# Patient Record
Sex: Male | Born: 1975 | Race: Black or African American | Hispanic: No | Marital: Married | Smoking: Former smoker
Health system: Southern US, Community
[De-identification: ages and names within clinical notes are randomized; demographics above are authoritative.]

## PROBLEM LIST (undated history)

## (undated) DIAGNOSIS — E7800 Pure hypercholesterolemia, unspecified: Secondary | ICD-10-CM

## (undated) DIAGNOSIS — T7840XA Allergy, unspecified, initial encounter: Secondary | ICD-10-CM

## (undated) HISTORY — DX: Allergy, unspecified, initial encounter: T78.40XA

## (undated) HISTORY — PX: OTHER SURGICAL HISTORY: SHX169

---

## 2015-03-22 ENCOUNTER — Encounter (HOSPITAL_COMMUNITY): Payer: Self-pay

## 2015-03-22 ENCOUNTER — Emergency Department (HOSPITAL_COMMUNITY)
Admission: EM | Admit: 2015-03-22 | Discharge: 2015-03-22 | Disposition: A | Discharge status: Home / Self Care | Payer: BLUE CROSS/BLUE SHIELD | Attending: Emergency Medicine | Admitting: Emergency Medicine

## 2015-03-22 DIAGNOSIS — S79922A Unspecified injury of left thigh, initial encounter: Secondary | ICD-10-CM | POA: Diagnosis present

## 2015-03-22 DIAGNOSIS — Y998 Other external cause status: Secondary | ICD-10-CM | POA: Insufficient documentation

## 2015-03-22 DIAGNOSIS — X58XXXA Exposure to other specified factors, initial encounter: Secondary | ICD-10-CM | POA: Diagnosis not present

## 2015-03-22 DIAGNOSIS — T148XXA Other injury of unspecified body region, initial encounter: Secondary | ICD-10-CM

## 2015-03-22 DIAGNOSIS — Y9389 Activity, other specified: Secondary | ICD-10-CM | POA: Insufficient documentation

## 2015-03-22 DIAGNOSIS — S76912A Strain of unspecified muscles, fascia and tendons at thigh level, left thigh, initial encounter: Secondary | ICD-10-CM | POA: Diagnosis not present

## 2015-03-22 DIAGNOSIS — Y9289 Other specified places as the place of occurrence of the external cause: Secondary | ICD-10-CM | POA: Insufficient documentation

## 2015-03-22 NOTE — Discharge Instructions (Signed)
-   Continue to take your muscle relaxer as prescribed by your doctor in [place] - May take over the counter tylenol or ibuprofen for pain control - Use ice or heat to the muscle  - Rest the muscle until pain resolves, do not work out until then.  - Return to ED with worsening pain, numbness or tingling in the left leg, weakness in the left leg or further worsening of symptoms

## 2015-03-22 NOTE — ED Notes (Signed)
Patient c/o left upper inner thigh pain since 03/12/15. Patient states he went to an UC and was told he had pulled a muscle. Patient states the pain is worse.

## 2015-03-22 NOTE — ED Provider Notes (Signed)
CSN: [id number]     Arrival date & time 03/22/15  4098 History   First MD Initiated Contact with Patient 03/22/15 1010     Chief Complaint  Patient presents with  . Leg Pain    HPI  Mario Cain is a 39 year old male presenting with left inner thigh pain for 10 days. He was seen at an urgent care 1 week ago and diagnosed with muscle strain and given muscle relaxer. Pt reports that the muscle pain has gotten worse in the past week. He states that he had intercourse a few days ago which exacerbated the pain. Movement of the legs exacerbates the pain. The pain is not present at rest. He has been using his muscle relaxers but states they do not work because he takes them at night and he "sleeps through it". He has not tried any OTC pain relievers. Denies weakness, tingling or numbness of the affected leg. Denies headaches, chest pain, SOB, abdominal pain, nausea and vomiting.   History reviewed. No pertinent past medical history. History reviewed. No pertinent past surgical history. History reviewed. No pertinent family history. Social History  Substance Use Topics  . Smoking status: Never Smoker   . Smokeless tobacco: Never Used  . Alcohol Use: None     Comment: socially    Review of Systems  Constitutional: Negative for fever.  Respiratory: Negative for shortness of breath.   Cardiovascular: Negative for chest pain.  Gastrointestinal: Negative for nausea, vomiting and abdominal pain.  Musculoskeletal: Positive for myalgias. Negative for back pain and gait problem.  Skin: Negative for rash and wound.  Neurological: Negative for weakness and headaches.      Allergies  Eggs or egg-derived products  Home Medications   Prior to Admission medications   Not on File   BP 119/71 mmHg  Pulse 74  Temp(Src) 98.3 F (36.8 C) (Oral)  Resp 16  SpO2 100% Physical Exam  Constitutional: He is oriented to person, place, and time. He appears well-developed and well-nourished. No distress.   HENT:  Head: Normocephalic and atraumatic.  Neck: Normal range of motion.  Cardiovascular: Normal rate, regular rhythm and normal heart sounds.   Cap refill < 3 Pedal pulses palpable  Pulmonary/Chest: Effort normal and breath sounds normal. No respiratory distress. He has no wheezes. He has no rales.  Musculoskeletal:  Normal ROM of left hip and knee. No TTP of hip. Mild TTP of left anterior and medial thigh. Gait is stable.   Neurological: He is alert and oriented to person, place, and time.  5/5 strength of left hip and knee. Testing strength of left hip elicits pain in inner thigh. Sensation intact to bilateral lower extremities.   Skin: Skin is warm and dry. No rash noted.  Psychiatric: He has a normal mood and affect. His behavior is normal.  Vitals reviewed.   ED Course  Procedures (including critical care time) Labs Review Labs Reviewed - No data to display  Imaging Review No results found. I have personally reviewed and evaluated these images and lab results as part of my medical decision-making.   EKG Interpretation None      MDM   Final diagnoses:  Muscle strain   Muscle Strain Pt presenting with 10 day history of muscle strain. He had been seen by an urgent care and diagnosed with muscle strain and given muscle relaxers. He reports compliance with these with minimal relief. He had intercourse recently which exacerbated his pain. Movement exacerbates pain, rest relieves  it. VSS. Nontoxic appearing. Medial thigh pain reproducible with flexion and abduction of hip. Left leg neurovascularly intact. Instructed pt to continue resting his leg and avoiding activities that exacerbate the leg pain; intercourse and working out specifically. Continue taking the muscle relaxers as prescribed by urgent care. Pt to take OTC motrin or tylenol for pain control. May try ice or heat to the area. Return to ED with increasing pain, numbness, tingling or weakness of left leg. Pt agrees  with this plan    Alveta Heimlich, PA-C 03/22/15 1119  Lorre Nick, MD 03/26/15 [phone]

## 2020-01-20 ENCOUNTER — Other Ambulatory Visit: Payer: Self-pay

## 2020-01-20 ENCOUNTER — Encounter (HOSPITAL_COMMUNITY): Payer: Self-pay

## 2020-01-20 ENCOUNTER — Emergency Department (HOSPITAL_COMMUNITY)
Admission: EM | Admit: 2020-01-20 | Discharge: 2020-01-20 | Disposition: A | Discharge status: Home / Self Care | Payer: BLUE CROSS/BLUE SHIELD | Attending: Emergency Medicine | Admitting: Emergency Medicine

## 2020-01-20 DIAGNOSIS — J069 Acute upper respiratory infection, unspecified: Secondary | ICD-10-CM | POA: Insufficient documentation

## 2020-01-20 DIAGNOSIS — J029 Acute pharyngitis, unspecified: Secondary | ICD-10-CM | POA: Diagnosis present

## 2020-01-20 DIAGNOSIS — Z20822 Contact with and (suspected) exposure to covid-19: Secondary | ICD-10-CM | POA: Insufficient documentation

## 2020-01-20 DIAGNOSIS — J36 Peritonsillar abscess: Secondary | ICD-10-CM | POA: Diagnosis not present

## 2020-01-20 LAB — GROUP A STREP BY PCR: Group A Strep by PCR: NOT DETECTED

## 2020-01-20 LAB — SARS CORONAVIRUS 2 BY RT PCR (HOSPITAL ORDER, PERFORMED IN ~~LOC~~ HOSPITAL LAB): SARS Coronavirus 2: NEGATIVE

## 2020-01-20 MED ORDER — IBUPROFEN 600 MG PO TABS
600.0000 mg | ORAL_TABLET | Freq: Four times a day (QID) | ORAL | 0 refills | Status: DC | PRN
Start: 2020-01-20 — End: 2022-04-29

## 2020-01-20 MED ORDER — CETIRIZINE HCL 10 MG PO TABS
10.0000 mg | ORAL_TABLET | Freq: Every day | ORAL | 0 refills | Status: DC
Start: 2020-01-20 — End: 2023-02-05

## 2020-01-20 NOTE — Discharge Instructions (Addendum)
He was seen in the ER for sore throat and congestion.  The strep test is negative.  We have tested you for COVID-19, the results should be back within the next 2 to 4 hours.  Please quarantine yourself until of Covid test results. Take the medications prescribed for symptom control.

## 2020-01-20 NOTE — ED Provider Notes (Signed)
[facility]-EMERGENCY DEPT Provider Note   CSN: [id number] Arrival date & time: 01/20/20  1433     History Chief Complaint  Patient presents with  . Sore Throat  . Facial Pain    Mario Cain is a 44 y.o. male.  HPI    44 year old male comes in a chief complaint of sore throat, facial pain.  Patient reports that he has been having 3-day history of sore throat, pain with swallowing, cough, URI-like symptoms including left-sided facial pain without earache.  No drainage from the ear.  Patient is also having malaise.  He denies fevers.  Patient denies sick exposures and is vaccinated.  He does indicate that he was in a social setting recently.  Patient has a mild cough.  History reviewed. No pertinent past medical history.  There are no problems to display for this patient.  History reviewed. No pertinent surgical history.     No family history on file.  Social History   Tobacco Use  . Smoking status: Never Smoker  . Smokeless tobacco: Never Used  Substance Use Topics  . Alcohol use: Not on file    Comment: socially  . Drug use: No    Home Medications Prior to Admission medications   Medication Sig Start Date End Date Taking? Authorizing Provider  cetirizine (ZYRTEC ALLERGY) 10 MG tablet Take 1 tablet (10 mg total) by mouth daily. 01/20/20   Derwood Kaplan, MD  ibuprofen (ADVIL) 600 MG tablet Take 1 tablet (600 mg total) by mouth every 6 (six) hours as needed. 01/20/20   Derwood Kaplan, MD    Allergies    Eggs or egg-derived products  Review of Systems   Review of Systems  Constitutional: Positive for activity change.  Respiratory: Positive for shortness of breath.   Cardiovascular: Negative for chest pain.  Gastrointestinal: Negative for nausea and vomiting.  Allergic/Immunologic: Negative for immunocompromised state.  Hematological: Does not bruise/bleed easily.    Physical Exam Updated Vital Signs BP 118/81 (BP Location: Right Arm)    Pulse 79   Temp 98.1 F (36.7 C) (Oral)   Resp 16   SpO2 96%   Physical Exam Vitals and nursing note reviewed.  Constitutional:      Appearance: He is well-developed.  HENT:     Head: Atraumatic.     Nose: No congestion or rhinorrhea.     Mouth/Throat:     Tonsils: Tonsillar exudate and tonsillar abscess present.  Cardiovascular:     Rate and Rhythm: Normal rate.  Pulmonary:     Effort: Pulmonary effort is normal.  Musculoskeletal:     Cervical back: Neck supple.  Skin:    General: Skin is warm.  Neurological:     Mental Status: He is alert and oriented to person, place, and time.     ED Results / Procedures / Treatments   Labs (all labs ordered are listed, but only abnormal results are displayed) Labs Reviewed  GROUP A STREP BY PCR  SARS CORONAVIRUS 2 BY RT PCR (HOSPITAL ORDER, PERFORMED IN [facility])    EKG None  Radiology No results found.  Procedures Procedures (including critical care time)  Medications Ordered in ED Medications - No data to display  ED Course  I have reviewed the triage vital signs and the nursing notes.  Pertinent labs & imaging results that were available during my care of the patient were reviewed by me and considered in my medical decision making (see chart for details).  MDM Rules/Calculators/A&P                          44 year old comes in w/ cc of sore throat, congestion. We will test him for COVID-19.  There are no tonsillar exudates.  No signs of stridor or airway obstruction.  Patient is nontoxic.    Mario Cain was evaluated in Emergency Department on 01/20/2020 for the symptoms described in the history of present illness. He was evaluated in the context of the global COVID-19 pandemic, which necessitated consideration that the patient might be at risk for infection with the SARS-CoV-2 virus that causes COVID-19. Institutional protocols and algorithms that pertain to the evaluation of patients at risk  for COVID-19 are in a state of rapid change based on information released by regulatory bodies including the CDC and federal and state organizations. These policies and algorithms were followed during the patient's care in the ED.  Final Clinical Impression(s) / ED Diagnoses Final diagnoses:  Viral URI  COVID-19 virus test result unknown    Rx / DC Orders ED Discharge Orders         Ordered    ibuprofen (ADVIL) 600 MG tablet  Every 6 hours PRN     Discontinue  Reprint     01/20/20 1918    cetirizine (ZYRTEC ALLERGY) 10 MG tablet  Daily     Discontinue  Reprint     01/20/20 1918           Derwood Kaplan, MD 01/20/20 2036

## 2020-01-20 NOTE — ED Triage Notes (Signed)
Patient c/o sore throat (left side) and sinus congestion since Sunday. Reports taking otc meds with no relief.    A/Ox4 Ambulatory in triage

## 2020-07-10 ENCOUNTER — Other Ambulatory Visit: Payer: Self-pay

## 2020-07-10 ENCOUNTER — Encounter (HOSPITAL_COMMUNITY): Payer: Self-pay | Admitting: Emergency Medicine

## 2020-07-10 ENCOUNTER — Emergency Department (HOSPITAL_COMMUNITY)
Admission: EM | Admit: 2020-07-10 | Discharge: 2020-07-10 | Disposition: A | Discharge status: Home / Self Care | Payer: BC Managed Care – PPO | Attending: Emergency Medicine | Admitting: Emergency Medicine

## 2020-07-10 DIAGNOSIS — M79602 Pain in left arm: Secondary | ICD-10-CM | POA: Insufficient documentation

## 2020-07-10 DIAGNOSIS — R202 Paresthesia of skin: Secondary | ICD-10-CM

## 2020-07-10 HISTORY — DX: Pure hypercholesterolemia, unspecified: E78.00

## 2020-07-10 NOTE — Discharge Instructions (Addendum)
Take 400 mg of ibuprofen every 6 hours for the next several days.  Return here for any problems

## 2020-07-10 NOTE — ED Provider Notes (Signed)
[facility]-EMERGENCY DEPT Provider Note   CSN: [id number] Arrival date & time: 07/10/20  1731     History Chief Complaint  Patient presents with   Arm Pain    Mario Cain is a 44 y.o. male.  44 year old male presents with left-sided arm pain times several days.  Patient states that is worse with certain movement and seems to start in his neck.  Denies any wrist drop.  No recent history of trauma.  Symptoms better with remaining still no treatment use prior to arrival        Past Medical History:  Diagnosis Date   High cholesterol     There are no problems to display for this patient.   History reviewed. No pertinent surgical history.     History reviewed. No pertinent family history.  Social History   Tobacco Use   Smoking status: Never Smoker   Smokeless tobacco: Never Used  Substance Use Topics   Drug use: No    Home Medications Prior to Admission medications   Medication Sig Start Date End Date Taking? Authorizing Provider  cetirizine (ZYRTEC ALLERGY) 10 MG tablet Take 1 tablet (10 mg total) by mouth daily. 01/20/20   Derwood Kaplan, MD  ibuprofen (ADVIL) 600 MG tablet Take 1 tablet (600 mg total) by mouth every 6 (six) hours as needed. 01/20/20   Derwood Kaplan, MD    Allergies    Eggs or egg-derived products  Review of Systems   Review of Systems  All other systems reviewed and are negative.   Physical Exam Updated Vital Signs BP 128/85 (BP Location: Right Arm)    Pulse 83    Temp 98.2 F (36.8 C) (Oral)    Resp 16    Ht 1.702 m (5\' 7" )    Wt 71.7 kg    SpO2 94%    BMI 24.75 kg/m   Physical Exam Vitals and nursing note reviewed.  Constitutional:      General: He is not in acute distress.    Appearance: Normal appearance. He is well-developed and well-nourished. He is not toxic-appearing.  HENT:     Head: Normocephalic and atraumatic.  Eyes:     General: Lids are normal.     Extraocular Movements: EOM normal.      Conjunctiva/sclera: Conjunctivae normal.     Pupils: Pupils are equal, round, and reactive to light.  Neck:     Thyroid: No thyroid mass.     Trachea: No tracheal deviation.  Cardiovascular:     Rate and Rhythm: Normal rate and regular rhythm.     Heart sounds: Normal heart sounds. No murmur heard. No gallop.   Pulmonary:     Effort: Pulmonary effort is normal. No respiratory distress.     Breath sounds: Normal breath sounds. No stridor. No decreased breath sounds, wheezing, rhonchi or rales.  Abdominal:     General: Bowel sounds are normal. There is no distension.     Palpations: Abdomen is soft.     Tenderness: There is no abdominal tenderness. There is no CVA tenderness or rebound.  Musculoskeletal:        General: No tenderness or edema. Normal range of motion.     Cervical back: Normal range of motion and neck supple.     Comments: Tenderness to palpation in left axilla.  Median, ulnar, radial nerves all intact.  Sensation also normal.  Skin:    General: Skin is warm and dry.     Findings: No abrasion  or rash.  Neurological:     Mental Status: He is alert and oriented to person, place, and time.     GCS: GCS eye subscore is 4. GCS verbal subscore is 5. GCS motor subscore is 6.     Cranial Nerves: No cranial nerve deficit.     Sensory: No sensory deficit.     Deep Tendon Reflexes: Strength normal.  Psychiatric:        Mood and Affect: Mood and affect normal.        Speech: Speech normal.        Behavior: Behavior normal.     ED Results / Procedures / Treatments   Labs (all labs ordered are listed, but only abnormal results are displayed) Labs Reviewed - No data to display  EKG None  Radiology No results found.  Procedures Procedures (including critical care time)  Medications Ordered in ED Medications - No data to display  ED Course  I have reviewed the triage vital signs and the nursing notes.  Pertinent labs & imaging results that were available  during my care of the patient were reviewed by me and considered in my medical decision making (see chart for details).    MDM Rules/Calculators/A&P                          Patient symptoms likely due to nerve root irritation.  Will prescribe ibuprofen return precautions given Final Clinical Impression(s) / ED Diagnoses Final diagnoses:  None    Rx / DC Orders ED Discharge Orders    None       Lorre Nick, MD 07/10/20 1755

## 2020-07-10 NOTE — ED Triage Notes (Signed)
Pt states that he has had a pain down the back of his L arm that worsens with movement of his arms and neck since yesterday. Denies chest pain. Alert and oriented.

## 2020-07-20 ENCOUNTER — Other Ambulatory Visit: Payer: Self-pay | Admitting: Family Medicine

## 2020-07-20 ENCOUNTER — Ambulatory Visit
Admission: RE | Admit: 2020-07-20 | Discharge: 2020-07-20 | Disposition: A | Discharge status: Home / Self Care | Payer: BC Managed Care – PPO | Source: Ambulatory Visit | Attending: Family Medicine | Admitting: Family Medicine

## 2020-07-20 DIAGNOSIS — M542 Cervicalgia: Secondary | ICD-10-CM

## 2021-02-09 ENCOUNTER — Emergency Department (HOSPITAL_BASED_OUTPATIENT_CLINIC_OR_DEPARTMENT_OTHER): Payer: BC Managed Care – PPO | Admitting: Radiology

## 2021-02-09 ENCOUNTER — Other Ambulatory Visit (HOSPITAL_BASED_OUTPATIENT_CLINIC_OR_DEPARTMENT_OTHER): Payer: Self-pay

## 2021-02-09 ENCOUNTER — Emergency Department (HOSPITAL_BASED_OUTPATIENT_CLINIC_OR_DEPARTMENT_OTHER)
Admission: EM | Admit: 2021-02-09 | Discharge: 2021-02-09 | Disposition: A | Discharge status: Home / Self Care | Payer: BC Managed Care – PPO | Attending: Emergency Medicine | Admitting: Emergency Medicine

## 2021-02-09 ENCOUNTER — Encounter (HOSPITAL_BASED_OUTPATIENT_CLINIC_OR_DEPARTMENT_OTHER): Payer: Self-pay | Admitting: Emergency Medicine

## 2021-02-09 ENCOUNTER — Other Ambulatory Visit: Payer: Self-pay

## 2021-02-09 DIAGNOSIS — R0789 Other chest pain: Secondary | ICD-10-CM | POA: Diagnosis not present

## 2021-02-09 DIAGNOSIS — K30 Functional dyspepsia: Secondary | ICD-10-CM | POA: Insufficient documentation

## 2021-02-09 DIAGNOSIS — R0781 Pleurodynia: Secondary | ICD-10-CM | POA: Diagnosis present

## 2021-02-09 DIAGNOSIS — R203 Hyperesthesia: Secondary | ICD-10-CM | POA: Insufficient documentation

## 2021-02-09 DIAGNOSIS — R110 Nausea: Secondary | ICD-10-CM | POA: Insufficient documentation

## 2021-02-09 DIAGNOSIS — R079 Chest pain, unspecified: Secondary | ICD-10-CM

## 2021-02-09 LAB — COMPREHENSIVE METABOLIC PANEL
ALT: 17 U/L (ref 0–44)
AST: 18 U/L (ref 15–41)
Albumin: 4.5 g/dL (ref 3.5–5.0)
Alkaline Phosphatase: 48 U/L (ref 38–126)
Anion gap: 10 (ref 5–15)
BUN: 11 mg/dL (ref 6–20)
CO2: 26 mmol/L (ref 22–32)
Calcium: 9.4 mg/dL (ref 8.9–10.3)
Chloride: 101 mmol/L (ref 98–111)
Creatinine, Ser: 1.05 mg/dL (ref 0.61–1.24)
GFR, Estimated: >60 mL/min (ref >60)
Glucose, Bld: 93 mg/dL (ref 70–99)
Potassium: 4.3 mmol/L (ref 3.5–5.1)
Sodium: 137 mmol/L (ref 135–145)
Total Bilirubin: 0.7 mg/dL (ref 0.3–1.2)
Total Protein: 7.6 g/dL (ref 6.5–8.1)

## 2021-02-09 LAB — CBC WITH DIFFERENTIAL/PLATELET
Abs Immature Granulocytes: 0.01 10*3/uL (ref 0.00–0.07)
Basophils Absolute: 0.1 10*3/uL (ref 0.0–0.1)
Basophils Relative: 2 %
Eosinophils Absolute: 0.3 10*3/uL (ref 0.0–0.5)
Eosinophils Relative: 8 %
HCT: 46.5 % (ref 39.0–52.0)
Hemoglobin: 16 g/dL (ref 13.0–17.0)
Immature Granulocytes: 0 %
Lymphocytes Relative: 30 %
Lymphs Abs: 1.3 10*3/uL (ref 0.7–4.0)
MCH: 29.7 pg (ref 26.0–34.0)
MCHC: 34.4 g/dL (ref 30.0–36.0)
MCV: 86.3 fL (ref 80.0–100.0)
Monocytes Absolute: 0.8 10*3/uL (ref 0.1–1.0)
Monocytes Relative: 17 %
Neutro Abs: 1.8 10*3/uL (ref 1.7–7.7)
Neutrophils Relative %: 43 %
Platelets: 289 10*3/uL (ref 150–400)
RBC: 5.39 MIL/uL (ref 4.22–5.81)
RDW: 12.3 % (ref 11.5–15.5)
WBC: 4.3 10*3/uL (ref 4.0–10.5)
nRBC: 0 % (ref 0.0–0.2)

## 2021-02-09 LAB — TROPONIN I (HIGH SENSITIVITY): Troponin I (High Sensitivity): <2 ng/L (ref <18)

## 2021-02-09 MED ORDER — CYCLOBENZAPRINE HCL 10 MG PO TABS
10.0000 mg | ORAL_TABLET | Freq: Two times a day (BID) | ORAL | 0 refills | Status: DC | PRN
Start: 2021-02-09 — End: 2022-04-29
  Filled 2021-02-09: qty 20, 10d supply, fill #0

## 2021-02-09 NOTE — ED Notes (Signed)
Patient verbalizes understanding of discharge instructions. Opportunity for questioning and answers were provided. Patient discharged from ED.  °

## 2021-02-09 NOTE — ED Provider Notes (Signed)
[facility] EMERGENCY DEPT Provider Note   CSN: [id number] Arrival date & time: 02/09/21  8527     History Chief Complaint  Patient presents with   Rib Injury    Mario Cain is a 45 y.o. male.  HPI     Left rib pain began 7/18 No injury, no recalled overuse injuries  Pain is sharp, severe, worse even with light touch No rash No shortness of breath Some nausea and indigestion  in association with the pain No leg pain or swelling, no recent surgeries or immobilization   Past Medical History:  Diagnosis Date   High cholesterol     There are no problems to display for this patient.   History reviewed. No pertinent surgical history.     History reviewed. No pertinent family history.  Social History   Tobacco Use   Smoking status: Never   Smokeless tobacco: Never  Substance Use Topics   Drug use: No    Home Medications Prior to Admission medications   Medication Sig Start Date End Date Taking? Authorizing Provider  cetirizine (ZYRTEC ALLERGY) 10 MG tablet Take 1 tablet (10 mg total) by mouth daily. 01/20/20  Yes Derwood Kaplan, MD  cyclobenzaprine (FLEXERIL) 10 MG tablet Take 1 tablet (10 mg total) by mouth 2 (two) times daily as needed for muscle spasms. 02/09/21  Yes Alvira Monday, MD  ibuprofen (ADVIL) 600 MG tablet Take 1 tablet (600 mg total) by mouth every 6 (six) hours as needed. 01/20/20  Yes Derwood Kaplan, MD    Allergies    Eggs or egg-derived products  Review of Systems   Review of Systems  Constitutional:  Negative for fever.  Respiratory:  Negative for cough and shortness of breath.   Cardiovascular:  Positive for chest pain.  Gastrointestinal:  Positive for nausea. Negative for abdominal pain, diarrhea and vomiting.  Genitourinary:  Negative for dysuria.  Musculoskeletal:  Negative for back pain.  Skin:  Negative for rash.  Neurological:  Negative for headaches.   Physical Exam Updated Vital Signs BP 114/85   Pulse  70   Temp 98.4 F (36.9 C) (Oral)   Resp 20   Ht 5\' 7"  (1.702 m)   Wt 74.8 kg   SpO2 98%   BMI 25.84 kg/m   Physical Exam Vitals and nursing note reviewed.  Constitutional:      General: He is not in acute distress.    Appearance: He is well-developed. He is not diaphoretic.  HENT:     Head: Normocephalic and atraumatic.  Eyes:     Conjunctiva/sclera: Conjunctivae normal.  Cardiovascular:     Rate and Rhythm: Normal rate and regular rhythm.     Heart sounds: No murmur heard.   No friction rub.  Pulmonary:     Effort: Pulmonary effort is normal. No respiratory distress.     Breath sounds: Normal breath sounds. No wheezing or rales.  Chest:     Chest wall: Tenderness present.  Abdominal:     General: There is no distension.     Palpations: Abdomen is soft.     Tenderness: There is no abdominal tenderness. There is no guarding.  Musculoskeletal:     Cervical back: Normal range of motion.  Skin:    General: Skin is warm and dry.  Neurological:     Mental Status: He is alert and oriented to person, place, and time.    ED Results / Procedures / Treatments   Labs (all labs ordered are listed, but  only abnormal results are displayed) Labs Reviewed  CBC WITH DIFFERENTIAL/PLATELET  COMPREHENSIVE METABOLIC PANEL  TROPONIN I (HIGH SENSITIVITY)    EKG EKG Interpretation  Date/Time:  Wednesday February 09 2021 10:40:52 EDT Ventricular Rate:  68 PR Interval:  217 QRS Duration: 83 QT Interval:  378 QTC Calculation: 402 R Axis:   35 Text Interpretation: Sinus rhythm Prolonged PR interval Low voltage, precordial leads Confirmed by Benjiman Core [phone]) on 02/10/2021 10:09:18 AM  Radiology DG Chest 2 View  Result Date: 02/09/2021 CLINICAL DATA:  Left rib pain and tenderness to touch about the lower chest. Symptoms began 02/07/2021. No known injury. EXAM: CHEST - 2 VIEW COMPARISON:  None. FINDINGS: Lungs clear. Heart size normal. No pneumothorax or pleural fluid. No acute  or focal bony abnormality. IMPRESSION: Normal exam. Electronically Signed   By: Drusilla Kanner M.D.   On: 02/09/2021 10:52    Procedures Procedures   Medications Ordered in ED Medications - No data to display  ED Course  I have reviewed the triage vital signs and the nursing notes.  Pertinent labs & imaging results that were available during my care of the patient were reviewed by me and considered in my medical decision making (see chart for details).    MDM Rules/Calculators/A&P                           45yo male with history of hyperlipidemia presents with left sided chest pain.  Chest pain with hyperesthesia, suspect more likely msk, nerve related or shingles without rash  NO dyspnea, asymmetric leg swelling, PERC negative and doubt PE.  Given sensation of nausea and indigestion, ordered testing to evaluate for ACS.  Troponin negative. EKG without acute changes. Patient discharged in stable condition with understanding of reasons to return.     Final Clinical Impression(s) / ED Diagnoses Final diagnoses:  Left-sided chest pain    Rx / DC Orders ED Discharge Orders          Ordered    cyclobenzaprine (FLEXERIL) 10 MG tablet  2 times daily PRN        02/09/21 1129             Alvira Monday, MD 02/10/21 2233

## 2021-02-09 NOTE — ED Triage Notes (Signed)
Pt arrives to ED with c/o of left sided rib cage pain. Pt reports tenderness to touch to the left lower ribs. Pain started on Monday morning 7/18 when he woke up. No known injury to area. Pt denies N/V, fevers, CP, or SOB.

## 2021-02-09 NOTE — ED Notes (Signed)
Patient is resting comfortably. 

## 2021-02-09 NOTE — Discharge Instructions (Addendum)
You may have zoster sine herpete versus other nerve related pain. Please follow up with your doctor for further evaluation and treatment, if you develop a shingles rash, you may seek treatment with PCP or urgent care.

## 2021-02-22 DIAGNOSIS — Z13228 Encounter for screening for other metabolic disorders: Secondary | ICD-10-CM | POA: Diagnosis not present

## 2021-02-22 DIAGNOSIS — Z13220 Encounter for screening for lipoid disorders: Secondary | ICD-10-CM | POA: Diagnosis not present

## 2021-02-22 DIAGNOSIS — E782 Mixed hyperlipidemia: Secondary | ICD-10-CM | POA: Diagnosis not present

## 2021-02-22 DIAGNOSIS — E079 Disorder of thyroid, unspecified: Secondary | ICD-10-CM | POA: Diagnosis not present

## 2021-02-22 DIAGNOSIS — Z0000 Encounter for general adult medical examination without abnormal findings: Secondary | ICD-10-CM | POA: Diagnosis not present

## 2021-02-24 ENCOUNTER — Other Ambulatory Visit (HOSPITAL_BASED_OUTPATIENT_CLINIC_OR_DEPARTMENT_OTHER): Payer: Self-pay

## 2021-02-25 ENCOUNTER — Other Ambulatory Visit: Payer: Self-pay | Admitting: Family Medicine

## 2021-02-25 DIAGNOSIS — K4090 Unilateral inguinal hernia, without obstruction or gangrene, not specified as recurrent: Secondary | ICD-10-CM

## 2021-03-01 ENCOUNTER — Other Ambulatory Visit: Payer: BC Managed Care – PPO

## 2021-03-09 ENCOUNTER — Other Ambulatory Visit: Payer: Self-pay | Admitting: Orthopedic Surgery

## 2021-03-09 DIAGNOSIS — M25552 Pain in left hip: Secondary | ICD-10-CM

## 2021-03-15 ENCOUNTER — Ambulatory Visit
Admission: RE | Admit: 2021-03-15 | Discharge: 2021-03-15 | Disposition: A | Discharge status: Home / Self Care | Payer: BC Managed Care – PPO | Source: Ambulatory Visit | Attending: Orthopedic Surgery | Admitting: Orthopedic Surgery

## 2021-03-15 ENCOUNTER — Other Ambulatory Visit: Payer: Self-pay

## 2021-03-15 DIAGNOSIS — M25552 Pain in left hip: Secondary | ICD-10-CM

## 2021-03-15 MED ORDER — IOPAMIDOL (ISOVUE-M 200) INJECTION 41%
1.0000 mL | Freq: Once | INTRAMUSCULAR | Status: AC
  Administered 2021-03-15: 1 mL via INTRA_ARTICULAR

## 2021-03-15 MED ORDER — METHYLPREDNISOLONE ACETATE 40 MG/ML INJ SUSP (RADIOLOG
80.0000 mg | Freq: Once | INTRAMUSCULAR | Status: AC
  Administered 2021-03-15: 80 mg via INTRA_ARTICULAR

## 2021-04-23 ENCOUNTER — Emergency Department (HOSPITAL_BASED_OUTPATIENT_CLINIC_OR_DEPARTMENT_OTHER)
Admission: EM | Admit: 2021-04-23 | Discharge: 2021-04-23 | Disposition: A | Discharge status: Home / Self Care | Payer: BC Managed Care – PPO | Attending: Emergency Medicine | Admitting: Emergency Medicine

## 2021-04-23 ENCOUNTER — Encounter (HOSPITAL_BASED_OUTPATIENT_CLINIC_OR_DEPARTMENT_OTHER): Payer: Self-pay | Admitting: Emergency Medicine

## 2021-04-23 ENCOUNTER — Other Ambulatory Visit: Payer: Self-pay

## 2021-04-23 DIAGNOSIS — R519 Headache, unspecified: Secondary | ICD-10-CM | POA: Insufficient documentation

## 2021-04-23 DIAGNOSIS — H53149 Visual discomfort, unspecified: Secondary | ICD-10-CM | POA: Diagnosis not present

## 2021-04-23 MED ORDER — DIPHENHYDRAMINE HCL 50 MG/ML IJ SOLN
50.0000 mg | Freq: Once | INTRAMUSCULAR | Status: AC
  Administered 2021-04-23: 50 mg via INTRAVENOUS
  Filled 2021-04-23: qty 1

## 2021-04-23 MED ORDER — SODIUM CHLORIDE 0.9 % IV BOLUS
1000.0000 mL | Freq: Once | INTRAVENOUS | Status: AC
  Administered 2021-04-23: 1000 mL via INTRAVENOUS

## 2021-04-23 MED ORDER — KETOROLAC TROMETHAMINE 30 MG/ML IJ SOLN
30.0000 mg | Freq: Once | INTRAMUSCULAR | Status: AC
  Administered 2021-04-23: 30 mg via INTRAVENOUS
  Filled 2021-04-23: qty 1

## 2021-04-23 MED ORDER — PROCHLORPERAZINE EDISYLATE 10 MG/2ML IJ SOLN
10.0000 mg | Freq: Once | INTRAMUSCULAR | Status: AC
  Administered 2021-04-23: 1 mg via INTRAVENOUS
  Filled 2021-04-23: qty 2

## 2021-04-23 NOTE — ED Notes (Signed)
Pt alert, oriented and resting at present. States his pain is gone and that he "just feels sleepy" at present.  Wife at bedside.  IV infusing without complication.

## 2021-04-23 NOTE — ED Notes (Signed)
Dr Tegeler in room w/pt now. 

## 2021-04-23 NOTE — ED Triage Notes (Signed)
Pt reports posterior right side headache pain and tension since Monday.  Tried OTC medications (tylenol, excedrin, ibuprofen) without relief.  Pt reports pain is constant. Pain worse with palpation.

## 2021-04-23 NOTE — ED Provider Notes (Signed)
[facility] EMERGENCY DEPT Provider Note   CSN: [id number] Arrival date & time: 04/23/21  1003     History Chief Complaint  Patient presents with   Headache    Mario Cain is a 45 y.o. male.  The history is provided by the patient and medical records. No language interpreter was used.  Headache Pain location:  R temporal and R parietal Quality:  Dull Radiates to:  Does not radiate Severity currently:  7/10 Severity at highest:  7/10 Onset quality:  Gradual Duration:  6 days Timing:  Constant Progression:  Waxing and waning Chronicity:  Recurrent Similar to prior headaches: yes   Context: bright light   Relieved by:  Nothing Worsened by:  Nothing Ineffective treatments:  NSAIDs Associated symptoms: photophobia   Associated symptoms: no abdominal pain, no back pain, no blurred vision, no congestion, no cough, no diarrhea, no dizziness, no eye pain, no facial pain, no fatigue, no fever, no loss of balance, no nausea, no neck pain, no neck stiffness, no numbness, no paresthesias, no seizures, no URI, no visual change, no vomiting and no weakness       Past Medical History:  Diagnosis Date   High cholesterol     There are no problems to display for this patient.   History reviewed. No pertinent surgical history.     No family history on file.  Social History   Tobacco Use   Smoking status: Never   Smokeless tobacco: Never  Substance Use Topics   Drug use: No    Home Medications Prior to Admission medications   Medication Sig Start Date End Date Taking? Authorizing Provider  Garlic 10 MG CAPS Take by mouth.   Yes [provider]  Ginkgo Biloba 40 MG TABS Take by mouth.   Yes [provider]  Turmeric (QC TUMERIC COMPLEX PO) Take by mouth.   Yes [provider]  cetirizine (ZYRTEC ALLERGY) 10 MG tablet Take 1 tablet (10 mg total) by mouth daily. 01/20/20   Derwood Kaplan, MD  cyclobenzaprine (FLEXERIL) 10 MG tablet  Take 1 tablet (10 mg total) by mouth 2 (two) times daily as needed for muscle spasms. 02/09/21   Alvira Monday, MD  ibuprofen (ADVIL) 600 MG tablet Take 1 tablet (600 mg total) by mouth every 6 (six) hours as needed. 01/20/20   Derwood Kaplan, MD    Allergies    Eggs or egg-derived products  Review of Systems   Review of Systems  Constitutional:  Negative for chills, fatigue and fever.  HENT:  Negative for congestion.   Eyes:  Positive for photophobia. Negative for blurred vision, pain, redness and visual disturbance.  Respiratory:  Negative for cough, chest tightness, shortness of breath and wheezing.   Cardiovascular:  Negative for chest pain and palpitations.  Gastrointestinal:  Negative for abdominal pain, constipation, diarrhea, nausea and vomiting.  Genitourinary:  Negative for flank pain.  Musculoskeletal:  Negative for back pain, neck pain and neck stiffness.  Neurological:  Positive for headaches. Negative for dizziness, seizures, weakness, light-headedness, numbness, paresthesias and loss of balance.  Psychiatric/Behavioral:  Negative for agitation and confusion.   All other systems reviewed and are negative.  Physical Exam Updated Vital Signs BP 126/89 (BP Location: Right Arm)   Pulse 70   Temp 98.3 F (36.8 C) (Oral)   Resp 16   Ht 5\' 7"  (1.702 m)   Wt 74.8 kg   SpO2 95%   BMI 25.84 kg/m   Physical Exam Vitals and nursing  note reviewed.  Constitutional:      General: He is not in acute distress.    Appearance: He is well-developed. He is not ill-appearing, toxic-appearing or diaphoretic.  HENT:     Head: Normocephalic and atraumatic.     Mouth/Throat:     Mouth: Mucous membranes are moist.  Eyes:     Extraocular Movements:     Right eye: Normal extraocular motion and no nystagmus.     Left eye: Normal extraocular motion and no nystagmus.     Conjunctiva/sclera: Conjunctivae normal.  Cardiovascular:     Rate and Rhythm: Normal rate and regular rhythm.      Heart sounds: No murmur heard. Pulmonary:     Effort: Pulmonary effort is normal. No respiratory distress.     Breath sounds: Normal breath sounds. No wheezing, rhonchi or rales.  Chest:     Chest wall: No tenderness.  Abdominal:     Palpations: Abdomen is soft.     Tenderness: There is no abdominal tenderness.  Musculoskeletal:        General: No swelling or tenderness.     Cervical back: Normal range of motion and neck supple. No rigidity.  Skin:    General: Skin is warm and dry.     Capillary Refill: Capillary refill takes less than 2 seconds.     Coloration: Skin is not pale.  Neurological:     Mental Status: He is alert.     Cranial Nerves: No cranial nerve deficit, dysarthria or facial asymmetry.     Sensory: No sensory deficit.     Motor: No weakness.     Coordination: Coordination normal.  Psychiatric:        Mood and Affect: Mood normal.        Behavior: Behavior is not agitated.    ED Results / Procedures / Treatments   Labs (all labs ordered are listed, but only abnormal results are displayed) Labs Reviewed - No data to display  EKG None  Radiology No results found.  Procedures Procedures   Medications Ordered in ED Medications  sodium chloride 0.9 % bolus 1,000 mL (0 mLs Intravenous Stopped 04/23/21 1207)  prochlorperazine (COMPAZINE) injection 10 mg (1 mg Intravenous Given 04/23/21 1107)  diphenhydrAMINE (BENADRYL) injection 50 mg (50 mg Intravenous Given 04/23/21 1108)  ketorolac (TORADOL) 30 MG/ML injection 30 mg (30 mg Intravenous Given 04/23/21 1108)    ED Course  I have reviewed the triage vital signs and the nursing notes.  Pertinent labs & imaging results that were available during my care of the patient were reviewed by me and considered in my medical decision making (see chart for details).    MDM Rules/Calculators/A&P                           Mario Cain is a 45 y.o. male with a past medical history significant for hyper cholesterolemia  and remote history of headaches who presents with headache.  According to patient, for the last 6 days or so he has had photophobia and moderate headache that has not been improving with his normal regimen of Motrin.  He reports no significant vision changes, nausea, vomiting and also denies any focal neurologic deficits.  Denies any trauma.  He reports it comes and goes but is been present longer than normal.  He reports is primarily on the right side of his head but denies any radiation into his neck or back and denies any  other focal neurologic complaints.  Patient denies fevers, chills, neck stiffness, rashes, or other medication use.  On exam, lungs clear and chest nontender.  Abdomen nontender.  No focal neurologic deficits on exam.  Pupil symmetric reactive normal extract movements.  Symmetric smile.  Normal finger-nose-finger testing.  Normal sensation and strength throughout extremities.  Normal range of motion of neck.  Patient well-appearing otherwise.  Clinically suspect a tension versus migrainous type headache.  Patient will be given a headache cocktail and reassess.  Low suspicion for acute intracranial bleed, stroke, infection, or other acute cause of symptoms at this time.  If symptoms improve, dissipate discharge home with patient follow-up with PCP  Patient's headache has nearly resolved and he is feeling better.  Patient be discharged home to follow-up with PCP and will be also given information for outpatient neurology.  Patient agrees and was discharged in good condition with improved symptoms.   Final Clinical Impression(s) / ED Diagnoses Final diagnoses:  Acute nonintractable headache, unspecified headache type    Rx / DC Orders ED Discharge Orders     None      Clinical Impression: 1. Acute nonintractable headache, unspecified headache type     Disposition: Discharge  Condition: Good  I have discussed the results, Dx and Tx plan with the pt(& family if  present). He/she/they expressed understanding and agree(s) with the plan. Discharge instructions discussed at great length. Strict return precautions discussed and pt &/or family have verbalized understanding of the instructions. No further questions at time of discharge.    New Prescriptions   No medications on file    Follow Up: Leilani Able, MD [address] [place] [address] [phone]     [facility] [address]     [address] [place] [address] [phone]    [facility] [address] [place] [address] [phone]       Danijah Noh, Canary Brim, MD 04/23/21 1515

## 2021-04-23 NOTE — ED Notes (Signed)
Dr Tegeler advised of pt status and condition.  Plan to hold remaining compazine administration at this time.

## 2021-04-23 NOTE — ED Notes (Signed)
Pt reports having taken Ashwagandha root and Lion's mane supplements for months (unavailable to load into med list formulary), but that he recently stopped taking them and all other supplements when his headache started.

## 2021-04-23 NOTE — ED Notes (Signed)
Upon administration of 0.79ml IV compazine, pt heartrate began climbing to 122bpm and pt stated he began to "feel weird" again.  Stopped administration of IV compazine and placed pt on heart monitor.

## 2021-04-23 NOTE — Discharge Instructions (Signed)
Your history and exam today are consistent with headache likely caused from a tension or migrainous source.  Your exam was otherwise completely reassuring from a neurologic standpoint and your symptoms improved with the headache medicines.  After fluids and medicines we feel you are safe for discharge home.  Please follow-up with PCP but also consider following up with neurology if your headaches persist.  If any symptoms change or worsen acutely, please return to the nearest emergency department.  Please rest and stay hydrated.

## 2021-05-09 DIAGNOSIS — M9901 Segmental and somatic dysfunction of cervical region: Secondary | ICD-10-CM | POA: Diagnosis not present

## 2021-05-09 DIAGNOSIS — M546 Pain in thoracic spine: Secondary | ICD-10-CM | POA: Diagnosis not present

## 2021-05-09 DIAGNOSIS — M62830 Muscle spasm of back: Secondary | ICD-10-CM | POA: Diagnosis not present

## 2021-05-09 DIAGNOSIS — M9902 Segmental and somatic dysfunction of thoracic region: Secondary | ICD-10-CM | POA: Diagnosis not present

## 2021-05-09 DIAGNOSIS — Z0289 Encounter for other administrative examinations: Secondary | ICD-10-CM | POA: Diagnosis not present

## 2021-05-13 DIAGNOSIS — M9901 Segmental and somatic dysfunction of cervical region: Secondary | ICD-10-CM | POA: Diagnosis not present

## 2021-05-13 DIAGNOSIS — M546 Pain in thoracic spine: Secondary | ICD-10-CM | POA: Diagnosis not present

## 2021-05-13 DIAGNOSIS — M62830 Muscle spasm of back: Secondary | ICD-10-CM | POA: Diagnosis not present

## 2021-05-13 DIAGNOSIS — M9902 Segmental and somatic dysfunction of thoracic region: Secondary | ICD-10-CM | POA: Diagnosis not present

## 2021-05-18 DIAGNOSIS — M9903 Segmental and somatic dysfunction of lumbar region: Secondary | ICD-10-CM | POA: Diagnosis not present

## 2021-05-18 DIAGNOSIS — M9902 Segmental and somatic dysfunction of thoracic region: Secondary | ICD-10-CM | POA: Diagnosis not present

## 2021-05-18 DIAGNOSIS — M62830 Muscle spasm of back: Secondary | ICD-10-CM | POA: Diagnosis not present

## 2021-05-18 DIAGNOSIS — M9905 Segmental and somatic dysfunction of pelvic region: Secondary | ICD-10-CM | POA: Diagnosis not present

## 2021-05-23 DIAGNOSIS — M9902 Segmental and somatic dysfunction of thoracic region: Secondary | ICD-10-CM | POA: Diagnosis not present

## 2021-05-23 DIAGNOSIS — M62830 Muscle spasm of back: Secondary | ICD-10-CM | POA: Diagnosis not present

## 2021-05-23 DIAGNOSIS — M9905 Segmental and somatic dysfunction of pelvic region: Secondary | ICD-10-CM | POA: Diagnosis not present

## 2021-05-23 DIAGNOSIS — M9903 Segmental and somatic dysfunction of lumbar region: Secondary | ICD-10-CM | POA: Diagnosis not present

## 2021-06-01 DIAGNOSIS — M9903 Segmental and somatic dysfunction of lumbar region: Secondary | ICD-10-CM | POA: Diagnosis not present

## 2021-06-01 DIAGNOSIS — M62830 Muscle spasm of back: Secondary | ICD-10-CM | POA: Diagnosis not present

## 2021-06-01 DIAGNOSIS — M9902 Segmental and somatic dysfunction of thoracic region: Secondary | ICD-10-CM | POA: Diagnosis not present

## 2021-06-01 DIAGNOSIS — M9905 Segmental and somatic dysfunction of pelvic region: Secondary | ICD-10-CM | POA: Diagnosis not present

## 2021-06-20 DIAGNOSIS — M62830 Muscle spasm of back: Secondary | ICD-10-CM | POA: Diagnosis not present

## 2021-06-20 DIAGNOSIS — M9905 Segmental and somatic dysfunction of pelvic region: Secondary | ICD-10-CM | POA: Diagnosis not present

## 2021-06-20 DIAGNOSIS — M9902 Segmental and somatic dysfunction of thoracic region: Secondary | ICD-10-CM | POA: Diagnosis not present

## 2021-06-20 DIAGNOSIS — M9903 Segmental and somatic dysfunction of lumbar region: Secondary | ICD-10-CM | POA: Diagnosis not present

## 2021-07-02 ENCOUNTER — Encounter (HOSPITAL_BASED_OUTPATIENT_CLINIC_OR_DEPARTMENT_OTHER): Payer: Self-pay

## 2021-07-02 ENCOUNTER — Emergency Department (HOSPITAL_BASED_OUTPATIENT_CLINIC_OR_DEPARTMENT_OTHER)
Admission: EM | Admit: 2021-07-02 | Discharge: 2021-07-02 | Disposition: A | Discharge status: Home / Self Care | Payer: BC Managed Care – PPO | Attending: Emergency Medicine | Admitting: Emergency Medicine

## 2021-07-02 ENCOUNTER — Other Ambulatory Visit: Payer: Self-pay

## 2021-07-02 ENCOUNTER — Emergency Department (HOSPITAL_BASED_OUTPATIENT_CLINIC_OR_DEPARTMENT_OTHER): Payer: BC Managed Care – PPO | Admitting: Radiology

## 2021-07-02 DIAGNOSIS — R0789 Other chest pain: Secondary | ICD-10-CM | POA: Insufficient documentation

## 2021-07-02 DIAGNOSIS — R079 Chest pain, unspecified: Secondary | ICD-10-CM

## 2021-07-02 LAB — BASIC METABOLIC PANEL
Anion gap: 7 (ref 5–15)
BUN: 13 mg/dL (ref 6–20)
CO2: 28 mmol/L (ref 22–32)
Calcium: 10 mg/dL (ref 8.9–10.3)
Chloride: 102 mmol/L (ref 98–111)
Creatinine, Ser: 1.11 mg/dL (ref 0.61–1.24)
GFR, Estimated: >60 mL/min (ref >60)
Glucose, Bld: 85 mg/dL (ref 70–99)
Potassium: 4.2 mmol/L (ref 3.5–5.1)
Sodium: 137 mmol/L (ref 135–145)

## 2021-07-02 LAB — CBC
HCT: 44 % (ref 39.0–52.0)
Hemoglobin: 15.6 g/dL (ref 13.0–17.0)
MCH: 30.8 pg (ref 26.0–34.0)
MCHC: 35.5 g/dL (ref 30.0–36.0)
MCV: 87 fL (ref 80.0–100.0)
Platelets: 275 10*3/uL (ref 150–400)
RBC: 5.06 MIL/uL (ref 4.22–5.81)
RDW: 13.4 % (ref 11.5–15.5)
WBC: 6.2 10*3/uL (ref 4.0–10.5)
nRBC: 0 % (ref 0.0–0.2)

## 2021-07-02 LAB — TROPONIN I (HIGH SENSITIVITY): Troponin I (High Sensitivity): 3 ng/L (ref <18)

## 2021-07-02 NOTE — ED Triage Notes (Signed)
Pt presents with Left side chest pain starting this am. [place] his pain began radiating to mid-sternum and up into his throat.

## 2021-07-02 NOTE — ED Notes (Signed)
Pt discharged to home. Discharge instructions have been discussed with patient and/or family members. Pt verbally acknowledges understanding d/c instructions, and endorses comprehension to checkout at registration before leaving.  °

## 2021-07-02 NOTE — ED Provider Notes (Signed)
[facility] EMERGENCY DEPT Provider Note   CSN: [id number] Arrival date & time: 07/02/21  1327     History Chief Complaint  Patient presents with   Chest Pain    Mario Cain is a 45 y.o. male.  Patient to ED for evaluation of chest discomfort described as a pressure that started in the lower left chest yesterday and escalated over time to include bilateral chest. No shortness of breath. No cough or congestion. He denies fever, nausea, vomiting. No aggravating or alleviating factors. He is a nonsmoker, denies cocaine/drug use. No history of heart disease.   The history is provided by the patient. No language interpreter was used.  Chest Pain Associated symptoms: no cough, no fever and no shortness of breath       Past Medical History:  Diagnosis Date   High cholesterol     There are no problems to display for this patient.   Past Surgical History:  Procedure Laterality Date   arthritis         No family history on file.  Social History   Tobacco Use   Smoking status: Never   Smokeless tobacco: Never  Substance Use Topics   Drug use: No    Home Medications Prior to Admission medications   Medication Sig Start Date End Date Taking? Authorizing Provider  cetirizine (ZYRTEC ALLERGY) 10 MG tablet Take 1 tablet (10 mg total) by mouth daily. 01/20/20   Derwood Kaplan, MD  cyclobenzaprine (FLEXERIL) 10 MG tablet Take 1 tablet (10 mg total) by mouth 2 (two) times daily as needed for muscle spasms. 02/09/21   Alvira Monday, MD  Garlic 10 MG CAPS Take by mouth.    [provider]  Ginkgo Biloba 40 MG TABS Take by mouth.    [provider]  ibuprofen (ADVIL) 600 MG tablet Take 1 tablet (600 mg total) by mouth every 6 (six) hours as needed. 01/20/20   Derwood Kaplan, MD  Turmeric (QC TUMERIC COMPLEX PO) Take by mouth.    [provider]    Allergies    Eggs or egg-derived products  Review of Systems   Review of Systems   Constitutional:  Negative for chills and fever.  HENT: Negative.    Respiratory: Negative.  Negative for cough and shortness of breath.   Cardiovascular:  Positive for chest pain.  Gastrointestinal: Negative.   Musculoskeletal: Negative.   Skin: Negative.   Neurological: Negative.    Physical Exam Updated Vital Signs BP 132/87 (BP Location: Right Arm)   Pulse 70   Temp 98.2 F (36.8 C)   Resp 17   SpO2 97%   Physical Exam Vitals and nursing note reviewed.  Constitutional:      Appearance: He is well-developed.  HENT:     Head: Normocephalic.  Cardiovascular:     Rate and Rhythm: Normal rate and regular rhythm.     Heart sounds: No murmur heard. Pulmonary:     Effort: Pulmonary effort is normal.     Breath sounds: Normal breath sounds. No wheezing, rhonchi or rales.  Chest:     Chest wall: No tenderness.  Abdominal:     General: Bowel sounds are normal.     Palpations: Abdomen is soft.     Tenderness: There is no abdominal tenderness. There is no guarding or rebound.  Musculoskeletal:        General: Normal range of motion.     Cervical back: Normal range of motion and neck supple.  Skin:  General: Skin is warm and dry.  Neurological:     General: No focal deficit present.     Mental Status: He is alert and oriented to person, place, and time.    ED Results / Procedures / Treatments   Labs (all labs ordered are listed, but only abnormal results are displayed) Labs Reviewed  BASIC METABOLIC PANEL  CBC  TROPONIN I (HIGH SENSITIVITY)  TROPONIN I (HIGH SENSITIVITY)    EKG EKG Interpretation  Date/Time:  Saturday July 02 2021 13:47:50 EST Ventricular Rate:  71 PR Interval:  212 QRS Duration: 78 QT Interval:  356 QTC Calculation: 386 R Axis:   49 Text Interpretation: Sinus rhythm with 1st degree A-V block Otherwise normal ECG No significant change since prior 7/22 Confirmed by Meridee Score [phone]) on 07/02/2021 2:53:35 PM  Radiology DG Chest 2  View  Result Date: 07/02/2021 CLINICAL DATA:  Mid chest pain beginning this morning. EXAM: CHEST - 2 VIEW COMPARISON:  02/09/2021 FINDINGS: Lungs are adequately inflated and otherwise clear. Cardiomediastinal silhouette and remainder of the exam is unchanged. IMPRESSION: No active cardiopulmonary disease. Electronically Signed   By: Elberta Fortis M.D.   On: 07/02/2021 14:40    Procedures Procedures   Medications Ordered in ED Medications - No data to display  ED Course  I have reviewed the triage vital signs and the nursing notes.  Pertinent labs & imaging results that were available during my care of the patient were reviewed by me and considered in my medical decision making (see chart for details).    MDM Rules/Calculators/A&P                           Patient to ED with ss/sxs as per HPI.   Very well appearing patient, in NAD, VSS. Labs are normal. CXR unremarkable. EKG NSR without change since previous of 7/22.   Do not feel symptoms represent ACS, dissection, pulmonary process. He can be discharged home to follow up with primary care.  Final Clinical Impression(s) / ED Diagnoses Final diagnoses:  None   Nonspecific chest pain  Rx / DC Orders ED Discharge Orders     None        Elpidio Anis, PA-C 07/02/21 1558    Terrilee Files, MD 07/03/21 1014

## 2021-11-14 LAB — HM COLONOSCOPY

## 2022-02-13 ENCOUNTER — Emergency Department (HOSPITAL_BASED_OUTPATIENT_CLINIC_OR_DEPARTMENT_OTHER)
Admission: EM | Admit: 2022-02-13 | Discharge: 2022-02-13 | Disposition: A | Discharge status: Home / Self Care | Payer: Commercial Managed Care - PPO | Attending: Emergency Medicine | Admitting: Emergency Medicine

## 2022-02-13 ENCOUNTER — Other Ambulatory Visit: Payer: Self-pay

## 2022-02-13 ENCOUNTER — Encounter (HOSPITAL_BASED_OUTPATIENT_CLINIC_OR_DEPARTMENT_OTHER): Payer: Self-pay

## 2022-02-13 DIAGNOSIS — Z20822 Contact with and (suspected) exposure to covid-19: Secondary | ICD-10-CM | POA: Insufficient documentation

## 2022-02-13 DIAGNOSIS — J029 Acute pharyngitis, unspecified: Secondary | ICD-10-CM | POA: Diagnosis present

## 2022-02-13 DIAGNOSIS — K122 Cellulitis and abscess of mouth: Secondary | ICD-10-CM | POA: Diagnosis not present

## 2022-02-13 LAB — RESP PANEL BY RT-PCR (FLU A&B, COVID) ARPGX2
Influenza A by PCR: NEGATIVE
Influenza B by PCR: NEGATIVE
SARS Coronavirus 2 by RT PCR: NEGATIVE

## 2022-02-13 LAB — GROUP A STREP BY PCR: Group A Strep by PCR: NOT DETECTED

## 2022-02-13 MED ORDER — DEXAMETHASONE SODIUM PHOSPHATE 10 MG/ML IJ SOLN
10.0000 mg | Freq: Once | INTRAMUSCULAR | Status: AC
  Administered 2022-02-13: 10 mg via INTRAMUSCULAR
  Filled 2022-02-13: qty 1

## 2022-02-13 MED ORDER — AMOXICILLIN 500 MG PO CAPS: 500.0000 mg | ORAL_CAPSULE | Freq: Two times a day (BID) | ORAL | 0 refills | Status: AC

## 2022-02-13 NOTE — ED Triage Notes (Signed)
Patient here POV from Home.  Endorses Sore Throat since this AM. Associated with Drainage, Mild Body Aches.  No Fevers. No Chills.   NAD Noted during Triage. A&Ox4. GCS 15. Ambulatory. Speaking in Complete Sentences.

## 2022-02-13 NOTE — Discharge Instructions (Addendum)
Gargle with warm salt water, drink warm tea with honey.  Be sure to drink plenty of fluids.  Can take Motrin and Tylenol as needed as directed for pain.  Take amoxicillin as prescribed and complete the full course.  The Decadron injection you were given today should help with the swelling of your uvula over the next 24 hours.  Return to the ER for any worsening or concerning symptoms.  Recheck with your doctor as needed.

## 2022-02-13 NOTE — ED Provider Notes (Signed)
[facility] EMERGENCY DEPT Provider Note   CSN: [id number] Arrival date & time: 02/13/22  1807     History  Chief Complaint  Patient presents with   Sore Throat    Nilan Iddings is a 46 y.o. male.  46 year old male presents with complaint of a sore throat since waking this morning.  States that he was out of town at a DJ concert, exposed to many people but no known sick contacts.  Woke up this morning with a sore throat, worse with swallowing.  Tried drinking warm tea without much improvement.  Denies fevers.  States he does have associated postnasal drip and a mild cough.  Patient complains that he feels like he is choking.       Home Medications Prior to Admission medications   Medication Sig Start Date End Date Taking? Authorizing Provider  amoxicillin (AMOXIL) 500 MG capsule Take 1 capsule (500 mg total) by mouth 2 (two) times daily for 10 days. 02/13/22 02/23/22 Yes Jeannie Fend, PA-C  cetirizine (ZYRTEC ALLERGY) 10 MG tablet Take 1 tablet (10 mg total) by mouth daily. 01/20/20   Derwood Kaplan, MD  cyclobenzaprine (FLEXERIL) 10 MG tablet Take 1 tablet (10 mg total) by mouth 2 (two) times daily as needed for muscle spasms. 02/09/21   Alvira Monday, MD  Garlic 10 MG CAPS Take by mouth.    [provider]  Ginkgo Biloba 40 MG TABS Take by mouth.    [provider]  ibuprofen (ADVIL) 600 MG tablet Take 1 tablet (600 mg total) by mouth every 6 (six) hours as needed. 01/20/20   Derwood Kaplan, MD  Turmeric (QC TUMERIC COMPLEX PO) Take by mouth.    [provider]      Allergies    Eggs or egg-derived products    Review of Systems   Review of Systems Negative except as per HPI Physical Exam Updated Vital Signs BP (!) 120/100 (BP Location: Right Arm)   Pulse 86   Temp 97.8 F (36.6 C)   Resp 16   Ht 5\' 7"  (1.702 m)   Wt 74.8 kg   SpO2 97%   BMI 25.83 kg/m  Physical Exam Vitals and nursing note reviewed.  Constitutional:       General: He is not in acute distress.    Appearance: He is well-developed. He is not diaphoretic.  HENT:     Head: Normocephalic and atraumatic.     Right Ear: Tympanic membrane and ear canal normal.     Left Ear: Tympanic membrane and ear canal normal.     Nose: Congestion present.     Mouth/Throat:     Mouth: No oral lesions.     Pharynx: Uvula swelling present. No pharyngeal swelling, oropharyngeal exudate or posterior oropharyngeal erythema.     Tonsils: No tonsillar exudate or tonsillar abscesses. 1+ on the right. 1+ on the left.  Eyes:     Conjunctiva/sclera: Conjunctivae normal.  Cardiovascular:     Rate and Rhythm: Normal rate and regular rhythm.     Heart sounds: Normal heart sounds.  Pulmonary:     Effort: Pulmonary effort is normal.     Breath sounds: Normal breath sounds.  Lymphadenopathy:     Cervical: No cervical adenopathy.  Skin:    General: Skin is warm and dry.     Findings: No erythema or rash.  Neurological:     Mental Status: He is alert and oriented to person, place, and time.  Psychiatric:  Behavior: Behavior normal.     ED Results / Procedures / Treatments   Labs (all labs ordered are listed, but only abnormal results are displayed) Labs Reviewed  GROUP A STREP BY PCR  RESP PANEL BY RT-PCR (FLU A&B, COVID) ARPGX2    EKG None  Radiology No results found.  Procedures Procedures    Medications Ordered in ED Medications  dexamethasone (DECADRON) injection 10 mg (has no administration in time range)    ED Course/ Medical Decision Making/ A&P                           Medical Decision Making  46 year old otherwise healthy male with complaint of sore throat and pain with swallowing.  He is found to have a boggy uvula.  His remaining exam is otherwise unremarkable.  Plan is to treat with Decadron and amoxicillin.  Recommend return to ER for worsening or concerning symptoms.  Otherwise he should take Motrin and Tylenol as needed as  directed for his discomfort and hydrate.        Final Clinical Impression(s) / ED Diagnoses Final diagnoses:  Uvulitis    Rx / DC Orders ED Discharge Orders          Ordered    amoxicillin (AMOXIL) 500 MG capsule  2 times daily        02/13/22 2111              Jeannie Fend, PA-C 02/13/22 2113    Rolan Bucco, MD 02/13/22 2317

## 2022-04-29 ENCOUNTER — Other Ambulatory Visit: Payer: Self-pay

## 2022-04-29 ENCOUNTER — Emergency Department (HOSPITAL_BASED_OUTPATIENT_CLINIC_OR_DEPARTMENT_OTHER)
Admission: EM | Admit: 2022-04-29 | Discharge: 2022-04-29 | Disposition: A | Discharge status: Home / Self Care | Payer: Commercial Managed Care - PPO | Attending: Emergency Medicine | Admitting: Emergency Medicine

## 2022-04-29 ENCOUNTER — Encounter (HOSPITAL_BASED_OUTPATIENT_CLINIC_OR_DEPARTMENT_OTHER): Payer: Self-pay | Admitting: Emergency Medicine

## 2022-04-29 ENCOUNTER — Emergency Department (HOSPITAL_BASED_OUTPATIENT_CLINIC_OR_DEPARTMENT_OTHER): Payer: Commercial Managed Care - PPO

## 2022-04-29 DIAGNOSIS — S29012A Strain of muscle and tendon of back wall of thorax, initial encounter: Secondary | ICD-10-CM

## 2022-04-29 DIAGNOSIS — X58XXXA Exposure to other specified factors, initial encounter: Secondary | ICD-10-CM | POA: Diagnosis not present

## 2022-04-29 DIAGNOSIS — S29002A Unspecified injury of muscle and tendon of back wall of thorax, initial encounter: Secondary | ICD-10-CM | POA: Diagnosis present

## 2022-04-29 LAB — CBC WITH DIFFERENTIAL/PLATELET
Abs Immature Granulocytes: 0.02 10*3/uL (ref 0.00–0.07)
Basophils Absolute: 0.1 10*3/uL (ref 0.0–0.1)
Basophils Relative: 1 %
Eosinophils Absolute: 0.3 10*3/uL (ref 0.0–0.5)
Eosinophils Relative: 5 %
HCT: 48.3 % (ref 39.0–52.0)
Hemoglobin: 17.2 g/dL — ABNORMAL HIGH (ref 13.0–17.0)
Immature Granulocytes: 0 %
Lymphocytes Relative: 46 %
Lymphs Abs: 3 10*3/uL (ref 0.7–4.0)
MCH: 31.4 pg (ref 26.0–34.0)
MCHC: 35.6 g/dL (ref 30.0–36.0)
MCV: 88.1 fL (ref 80.0–100.0)
Monocytes Absolute: 0.5 10*3/uL (ref 0.1–1.0)
Monocytes Relative: 8 %
Neutro Abs: 2.6 10*3/uL (ref 1.7–7.7)
Neutrophils Relative %: 40 %
Platelets: 292 10*3/uL (ref 150–400)
RBC: 5.48 MIL/uL (ref 4.22–5.81)
RDW: 12.8 % (ref 11.5–15.5)
WBC: 6.5 10*3/uL (ref 4.0–10.5)
nRBC: 0 % (ref 0.0–0.2)

## 2022-04-29 LAB — BASIC METABOLIC PANEL
Anion gap: 8 (ref 5–15)
BUN: 18 mg/dL (ref 6–20)
CO2: 29 mmol/L (ref 22–32)
Calcium: 10 mg/dL (ref 8.9–10.3)
Chloride: 100 mmol/L (ref 98–111)
Creatinine, Ser: 1.38 mg/dL — ABNORMAL HIGH (ref 0.61–1.24)
GFR, Estimated: >60 mL/min (ref >60)
Glucose, Bld: 86 mg/dL (ref 70–99)
Potassium: 4.1 mmol/L (ref 3.5–5.1)
Sodium: 137 mmol/L (ref 135–145)

## 2022-04-29 LAB — URINALYSIS, ROUTINE W REFLEX MICROSCOPIC
Bilirubin Urine: NEGATIVE
Glucose, UA: NEGATIVE mg/dL
Hgb urine dipstick: NEGATIVE
Ketones, ur: 15 mg/dL — AB
Nitrite: NEGATIVE
Protein, ur: NEGATIVE mg/dL
Specific Gravity, Urine: 1.023 (ref 1.005–1.030)
pH: 6.5 (ref 5.0–8.0)

## 2022-04-29 MED ORDER — IBUPROFEN 600 MG PO TABS: 600.0000 mg | ORAL_TABLET | Freq: Four times a day (QID) | ORAL | 0 refills | Status: DC | PRN

## 2022-04-29 MED ORDER — CYCLOBENZAPRINE HCL 10 MG PO TABS: 10.0000 mg | ORAL_TABLET | Freq: Two times a day (BID) | ORAL | 0 refills | Status: DC | PRN

## 2022-04-29 MED ORDER — KETOROLAC TROMETHAMINE 30 MG/ML IJ SOLN
30.0000 mg | Freq: Once | INTRAMUSCULAR | Status: AC
  Administered 2022-04-29: 30 mg via INTRAVENOUS
  Filled 2022-04-29: qty 1

## 2022-04-29 NOTE — ED Provider Notes (Signed)
[facility] EMERGENCY DEPT Provider Note   CSN: [id number] Arrival date & time: 04/29/22  1637     History  Chief Complaint  Patient presents with   Back Pain    Mario Cain is a 46 y.o. male.  Pt is a 46 yo male with pmhx significant for high cholesterol.  Pt said he woke up from sleep and had pain in his right upper/mid back.  He said he's been doing more ab and back work outs at [employer], but does not recall anything specific that hurt.  Pain worse with movement and when he does leg raises.  No bad pain.  No numbness in legs currently.  He does have numbness in legs sometimes when he is on the toilet for a prolonged time.       Home Medications Prior to Admission medications   Medication Sig Start Date End Date Taking? Authorizing Provider  cetirizine (ZYRTEC ALLERGY) 10 MG tablet Take 1 tablet (10 mg total) by mouth daily. 01/20/20   Derwood Kaplan, MD  cyclobenzaprine (FLEXERIL) 10 MG tablet Take 1 tablet (10 mg total) by mouth 2 (two) times daily as needed for muscle spasms. 04/29/22   Jacalyn Lefevre, MD  Garlic 10 MG CAPS Take by mouth.    [provider]  Ginkgo Biloba 40 MG TABS Take by mouth.    [provider]  ibuprofen (ADVIL) 600 MG tablet Take 1 tablet (600 mg total) by mouth every 6 (six) hours as needed. 04/29/22   Jacalyn Lefevre, MD  Turmeric (QC TUMERIC COMPLEX PO) Take by mouth.    [provider]      Allergies    Eggs or egg-derived products    Review of Systems   Review of Systems  Musculoskeletal:  Positive for back pain.  All other systems reviewed and are negative.   Physical Exam Updated Vital Signs BP 124/83   Pulse (!) 53   Temp 98.4 F (36.9 C)   Resp 18   SpO2 100%  Physical Exam Vitals and nursing note reviewed.  Constitutional:      Appearance: Normal appearance.  HENT:     Head: Normocephalic and atraumatic.     Right Ear: External ear normal.     Left Ear: External ear normal.      Nose: Nose normal.     Mouth/Throat:     Mouth: Mucous membranes are moist.     Pharynx: Oropharynx is clear.  Eyes:     Extraocular Movements: Extraocular movements intact.     Conjunctiva/sclera: Conjunctivae normal.     Pupils: Pupils are equal, round, and reactive to light.  Cardiovascular:     Rate and Rhythm: Normal rate and regular rhythm.     Pulses: Normal pulses.     Heart sounds: Normal heart sounds.  Pulmonary:     Effort: Pulmonary effort is normal.     Breath sounds: Normal breath sounds.  Abdominal:     General: Abdomen is flat. Bowel sounds are normal.     Palpations: Abdomen is soft.  Musculoskeletal:       Arms:     Cervical back: Normal range of motion and neck supple.  Skin:    General: Skin is warm.     Capillary Refill: Capillary refill takes less than 2 seconds.  Neurological:     General: No focal deficit present.     Mental Status: He is alert and oriented to person, place, and time.  Psychiatric:  Mood and Affect: Mood normal.        Behavior: Behavior normal.     ED Results / Procedures / Treatments   Labs (all labs ordered are listed, but only abnormal results are displayed) Labs Reviewed  BASIC METABOLIC PANEL - Abnormal; Notable for the following components:      Result Value   Creatinine, Ser 1.38 (*)    All other components within normal limits  CBC WITH DIFFERENTIAL/PLATELET - Abnormal; Notable for the following components:   Hemoglobin 17.2 (*)    All other components within normal limits  URINALYSIS, ROUTINE W REFLEX MICROSCOPIC - Abnormal; Notable for the following components:   Ketones, ur 15 (*)    Leukocytes,Ua SMALL (*)    Bacteria, UA RARE (*)    All other components within normal limits    EKG EKG Interpretation  Date/Time:  Saturday April 29 2022 16:58:27 EDT Ventricular Rate:  65 PR Interval:  190 QRS Duration: 82 QT Interval:  358 QTC Calculation: 372 R Axis:   41 Text Interpretation: Normal sinus  rhythm Normal ECG When compared with ECG of 02-Jul-2021 13:47, No significant change was found Confirmed by Jacalyn Lefevre [phone]) on 04/29/2022 10:27:04 PM  Radiology DG Chest Port 1 View  Result Date: 04/29/2022 CLINICAL DATA:  Pain in the mid back, worse with breathing. EXAM: PORTABLE CHEST 1 VIEW COMPARISON:  07/02/2021 FINDINGS: The heart size and mediastinal contours are within normal limits. Both lungs are clear. The visualized skeletal structures are unremarkable. IMPRESSION: No active disease. Electronically Signed   By: Burman Nieves M.D.   On: 04/29/2022 19:05    Procedures Procedures    Medications Ordered in ED Medications  ketorolac (TORADOL) 30 MG/ML injection 30 mg (30 mg Intravenous Given 04/29/22 2211)    ED Course/ Medical Decision Making/ A&P                           Medical Decision Making Amount and/or Complexity of Data Reviewed Labs: ordered. Radiology: ordered.  Risk Prescription drug management.   This patient presents to the ED for concern of back pain, this involves an extensive number of treatment options, and is a complaint that carries with it a high risk of complications and morbidity.  The differential diagnosis includes kidney stone, msk,    Co morbidities that complicate the patient evaluation  High cholesterol   Additional history obtained:  Additional history obtained from epic chart review    Lab Tests:  I Ordered, and personally interpreted labs.  The pertinent results include:  ua + ketones, cbc nl; bmp with cr 1.38, but gfr >60   Imaging Studies ordered:  I ordered imaging studies including cxr  I independently visualized and interpreted imaging which showed  IMPRESSION:  No active disease.   I agree with the radiologist interpretation   Cardiac Monitoring:  The patient was maintained on a cardiac monitor.  I personally viewed and interpreted the cardiac monitored which showed an underlying rhythm of:  nsr   Medicines ordered and prescription drug management:  I ordered medication including toradol  for pain  Reevaluation of the patient after these medicines showed that the patient improved I have reviewed the patients home medicines and have made adjustments as needed    Problem List / ED Course:  Back pain:  likely msk.  Pt does have spasm on his right upper back.  Pt is stable for d/c.  Return if worse.  F/u  with pcp.   Reevaluation:  After the interventions noted above, I reevaluated the patient and found that they have :improved   Social Determinants of Health:  Lives at home   Dispostion:  After consideration of the diagnostic results and the patients response to treatment, I feel that the patent would benefit from discharge with outpatient f/u.          Final Clinical Impression(s) / ED Diagnoses Final diagnoses:  Strain of thoracic paraspinal muscles excluding T1 and T2 levels, initial encounter    Rx / DC Orders ED Discharge Orders          Ordered    cyclobenzaprine (FLEXERIL) 10 MG tablet  2 times daily PRN        04/29/22 2258    ibuprofen (ADVIL) 600 MG tablet  Every 6 hours PRN        04/29/22 2258              Isla Pence, MD 04/29/22 2258

## 2022-04-29 NOTE — ED Triage Notes (Signed)
Pt presents to ED POV. Pt c/o pain in mid back. Pt reports that it worsens w/ movement and breathing. Reports no relief w otc meds

## 2022-08-30 DIAGNOSIS — M9903 Segmental and somatic dysfunction of lumbar region: Secondary | ICD-10-CM | POA: Diagnosis not present

## 2022-08-30 DIAGNOSIS — M25552 Pain in left hip: Secondary | ICD-10-CM | POA: Diagnosis not present

## 2022-08-30 DIAGNOSIS — M9905 Segmental and somatic dysfunction of pelvic region: Secondary | ICD-10-CM | POA: Diagnosis not present

## 2022-08-30 DIAGNOSIS — M5386 Other specified dorsopathies, lumbar region: Secondary | ICD-10-CM | POA: Diagnosis not present

## 2022-08-31 DIAGNOSIS — M5386 Other specified dorsopathies, lumbar region: Secondary | ICD-10-CM | POA: Diagnosis not present

## 2022-08-31 DIAGNOSIS — M9905 Segmental and somatic dysfunction of pelvic region: Secondary | ICD-10-CM | POA: Diagnosis not present

## 2022-08-31 DIAGNOSIS — M25552 Pain in left hip: Secondary | ICD-10-CM | POA: Diagnosis not present

## 2022-08-31 DIAGNOSIS — M9903 Segmental and somatic dysfunction of lumbar region: Secondary | ICD-10-CM | POA: Diagnosis not present

## 2022-09-04 DIAGNOSIS — M5386 Other specified dorsopathies, lumbar region: Secondary | ICD-10-CM | POA: Diagnosis not present

## 2022-09-04 DIAGNOSIS — M25552 Pain in left hip: Secondary | ICD-10-CM | POA: Diagnosis not present

## 2022-09-04 DIAGNOSIS — M9903 Segmental and somatic dysfunction of lumbar region: Secondary | ICD-10-CM | POA: Diagnosis not present

## 2022-09-04 DIAGNOSIS — M9905 Segmental and somatic dysfunction of pelvic region: Secondary | ICD-10-CM | POA: Diagnosis not present

## 2022-09-05 DIAGNOSIS — M25552 Pain in left hip: Secondary | ICD-10-CM | POA: Diagnosis not present

## 2022-09-05 DIAGNOSIS — M5386 Other specified dorsopathies, lumbar region: Secondary | ICD-10-CM | POA: Diagnosis not present

## 2022-09-05 DIAGNOSIS — M9903 Segmental and somatic dysfunction of lumbar region: Secondary | ICD-10-CM | POA: Diagnosis not present

## 2022-09-05 DIAGNOSIS — M9905 Segmental and somatic dysfunction of pelvic region: Secondary | ICD-10-CM | POA: Diagnosis not present

## 2022-09-07 DIAGNOSIS — M25552 Pain in left hip: Secondary | ICD-10-CM | POA: Diagnosis not present

## 2022-09-07 DIAGNOSIS — M9903 Segmental and somatic dysfunction of lumbar region: Secondary | ICD-10-CM | POA: Diagnosis not present

## 2022-09-07 DIAGNOSIS — M9905 Segmental and somatic dysfunction of pelvic region: Secondary | ICD-10-CM | POA: Diagnosis not present

## 2022-09-07 DIAGNOSIS — M5386 Other specified dorsopathies, lumbar region: Secondary | ICD-10-CM | POA: Diagnosis not present

## 2022-09-11 DIAGNOSIS — M25552 Pain in left hip: Secondary | ICD-10-CM | POA: Diagnosis not present

## 2022-09-11 DIAGNOSIS — M9903 Segmental and somatic dysfunction of lumbar region: Secondary | ICD-10-CM | POA: Diagnosis not present

## 2022-09-11 DIAGNOSIS — M5386 Other specified dorsopathies, lumbar region: Secondary | ICD-10-CM | POA: Diagnosis not present

## 2022-09-11 DIAGNOSIS — M9905 Segmental and somatic dysfunction of pelvic region: Secondary | ICD-10-CM | POA: Diagnosis not present

## 2022-09-12 DIAGNOSIS — M9903 Segmental and somatic dysfunction of lumbar region: Secondary | ICD-10-CM | POA: Diagnosis not present

## 2022-09-12 DIAGNOSIS — M9905 Segmental and somatic dysfunction of pelvic region: Secondary | ICD-10-CM | POA: Diagnosis not present

## 2022-09-12 DIAGNOSIS — M25552 Pain in left hip: Secondary | ICD-10-CM | POA: Diagnosis not present

## 2022-09-12 DIAGNOSIS — M5386 Other specified dorsopathies, lumbar region: Secondary | ICD-10-CM | POA: Diagnosis not present

## 2022-09-14 DIAGNOSIS — M9903 Segmental and somatic dysfunction of lumbar region: Secondary | ICD-10-CM | POA: Diagnosis not present

## 2022-09-14 DIAGNOSIS — M25552 Pain in left hip: Secondary | ICD-10-CM | POA: Diagnosis not present

## 2022-09-14 DIAGNOSIS — M5386 Other specified dorsopathies, lumbar region: Secondary | ICD-10-CM | POA: Diagnosis not present

## 2022-09-14 DIAGNOSIS — M9905 Segmental and somatic dysfunction of pelvic region: Secondary | ICD-10-CM | POA: Diagnosis not present

## 2022-09-18 DIAGNOSIS — M9905 Segmental and somatic dysfunction of pelvic region: Secondary | ICD-10-CM | POA: Diagnosis not present

## 2022-09-18 DIAGNOSIS — M5386 Other specified dorsopathies, lumbar region: Secondary | ICD-10-CM | POA: Diagnosis not present

## 2022-09-18 DIAGNOSIS — M9903 Segmental and somatic dysfunction of lumbar region: Secondary | ICD-10-CM | POA: Diagnosis not present

## 2022-09-18 DIAGNOSIS — M25552 Pain in left hip: Secondary | ICD-10-CM | POA: Diagnosis not present

## 2022-09-19 DIAGNOSIS — M9905 Segmental and somatic dysfunction of pelvic region: Secondary | ICD-10-CM | POA: Diagnosis not present

## 2022-09-19 DIAGNOSIS — M9903 Segmental and somatic dysfunction of lumbar region: Secondary | ICD-10-CM | POA: Diagnosis not present

## 2022-09-19 DIAGNOSIS — M25552 Pain in left hip: Secondary | ICD-10-CM | POA: Diagnosis not present

## 2022-09-19 DIAGNOSIS — M5386 Other specified dorsopathies, lumbar region: Secondary | ICD-10-CM | POA: Diagnosis not present

## 2022-09-21 DIAGNOSIS — M5386 Other specified dorsopathies, lumbar region: Secondary | ICD-10-CM | POA: Diagnosis not present

## 2022-09-21 DIAGNOSIS — M9903 Segmental and somatic dysfunction of lumbar region: Secondary | ICD-10-CM | POA: Diagnosis not present

## 2022-09-21 DIAGNOSIS — M25552 Pain in left hip: Secondary | ICD-10-CM | POA: Diagnosis not present

## 2022-09-21 DIAGNOSIS — M9905 Segmental and somatic dysfunction of pelvic region: Secondary | ICD-10-CM | POA: Diagnosis not present

## 2022-09-25 DIAGNOSIS — M25552 Pain in left hip: Secondary | ICD-10-CM | POA: Diagnosis not present

## 2022-09-25 DIAGNOSIS — M5386 Other specified dorsopathies, lumbar region: Secondary | ICD-10-CM | POA: Diagnosis not present

## 2022-09-25 DIAGNOSIS — M9905 Segmental and somatic dysfunction of pelvic region: Secondary | ICD-10-CM | POA: Diagnosis not present

## 2022-09-25 DIAGNOSIS — M9903 Segmental and somatic dysfunction of lumbar region: Secondary | ICD-10-CM | POA: Diagnosis not present

## 2022-09-26 DIAGNOSIS — M5386 Other specified dorsopathies, lumbar region: Secondary | ICD-10-CM | POA: Diagnosis not present

## 2022-09-26 DIAGNOSIS — M9903 Segmental and somatic dysfunction of lumbar region: Secondary | ICD-10-CM | POA: Diagnosis not present

## 2022-09-26 DIAGNOSIS — M25552 Pain in left hip: Secondary | ICD-10-CM | POA: Diagnosis not present

## 2022-09-26 DIAGNOSIS — M9905 Segmental and somatic dysfunction of pelvic region: Secondary | ICD-10-CM | POA: Diagnosis not present

## 2022-10-02 DIAGNOSIS — M5386 Other specified dorsopathies, lumbar region: Secondary | ICD-10-CM | POA: Diagnosis not present

## 2022-10-02 DIAGNOSIS — M9905 Segmental and somatic dysfunction of pelvic region: Secondary | ICD-10-CM | POA: Diagnosis not present

## 2022-10-02 DIAGNOSIS — M9903 Segmental and somatic dysfunction of lumbar region: Secondary | ICD-10-CM | POA: Diagnosis not present

## 2022-10-02 DIAGNOSIS — M25552 Pain in left hip: Secondary | ICD-10-CM | POA: Diagnosis not present

## 2022-10-05 DIAGNOSIS — M25552 Pain in left hip: Secondary | ICD-10-CM | POA: Diagnosis not present

## 2022-10-05 DIAGNOSIS — M9905 Segmental and somatic dysfunction of pelvic region: Secondary | ICD-10-CM | POA: Diagnosis not present

## 2022-10-05 DIAGNOSIS — M9903 Segmental and somatic dysfunction of lumbar region: Secondary | ICD-10-CM | POA: Diagnosis not present

## 2022-10-05 DIAGNOSIS — M5386 Other specified dorsopathies, lumbar region: Secondary | ICD-10-CM | POA: Diagnosis not present

## 2022-10-09 DIAGNOSIS — M9905 Segmental and somatic dysfunction of pelvic region: Secondary | ICD-10-CM | POA: Diagnosis not present

## 2022-10-09 DIAGNOSIS — M9903 Segmental and somatic dysfunction of lumbar region: Secondary | ICD-10-CM | POA: Diagnosis not present

## 2022-10-09 DIAGNOSIS — M5386 Other specified dorsopathies, lumbar region: Secondary | ICD-10-CM | POA: Diagnosis not present

## 2022-10-09 DIAGNOSIS — M25552 Pain in left hip: Secondary | ICD-10-CM | POA: Diagnosis not present

## 2022-10-12 IMAGING — XA DG FLUORO GUIDE NDL PLC/BX
1 series · 1 of 1 positions shown · IV contrast (isovue)
Comparison: Previous MRI

CLINICAL DATA: Pain in left hip.  Labral tear by history.

EXAM:
FLUORO GUIDED NEEDLE PLACEMENT AND/OR ASPIRATION
TECHNIQUE: The overlying skin was prepped with Betadine, draped in the usual
sterile fashion, and infiltrated locally with 1% Lidocaine. A 22
gauge spinal needle was advanced under fluoroscopic observation to
the lateral femoral neck. Confirmatory injection of less than 1 ml
of Isovue 200 demonstrates intra-articular spread without
intravascular component.
Eighty mg Depo-Medrol and 3 ml 1% lidocaine were then instilled. The
procedure was well tolerated. The patient was observed for a short
period of time then discharged in good condition.
FLUOROSCOPY TIME:  0 minutes 17 seconds. 8.61 micro gray meter
squared

[Series 1: ortho standard · 1 of 1 slices shown]
[im 1/1]
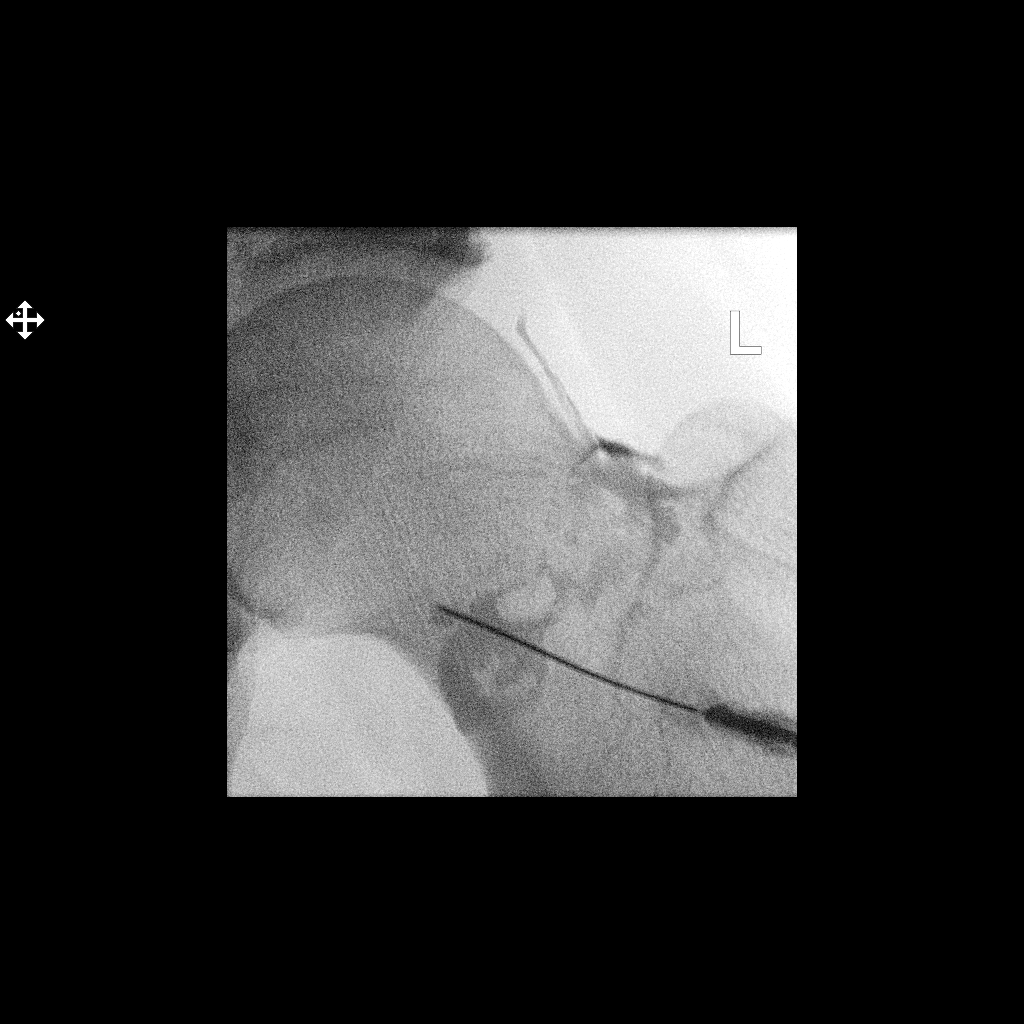

[1 of 1 positions shown; findings below may reference images not displayed]

IMPRESSION: Technically successful left hip injection under fluoroscopy.

## 2023-01-11 DIAGNOSIS — E291 Testicular hypofunction: Secondary | ICD-10-CM | POA: Diagnosis not present

## 2023-01-11 DIAGNOSIS — Z1329 Encounter for screening for other suspected endocrine disorder: Secondary | ICD-10-CM | POA: Diagnosis not present

## 2023-01-11 DIAGNOSIS — Z79890 Hormone replacement therapy (postmenopausal): Secondary | ICD-10-CM | POA: Diagnosis not present

## 2023-01-15 DIAGNOSIS — M2550 Pain in unspecified joint: Secondary | ICD-10-CM | POA: Diagnosis not present

## 2023-01-15 DIAGNOSIS — E291 Testicular hypofunction: Secondary | ICD-10-CM | POA: Diagnosis not present

## 2023-01-15 DIAGNOSIS — Z6824 Body mass index (BMI) 24.0-24.9, adult: Secondary | ICD-10-CM | POA: Diagnosis not present

## 2023-01-29 IMAGING — DX DG CHEST 2V
2 series · 2 of 2 positions shown · non-contrast
Comparison: 02/09/2021

CLINICAL DATA: Mid chest pain beginning this morning.

EXAM:
CHEST - 2 VIEW

[chest pa]
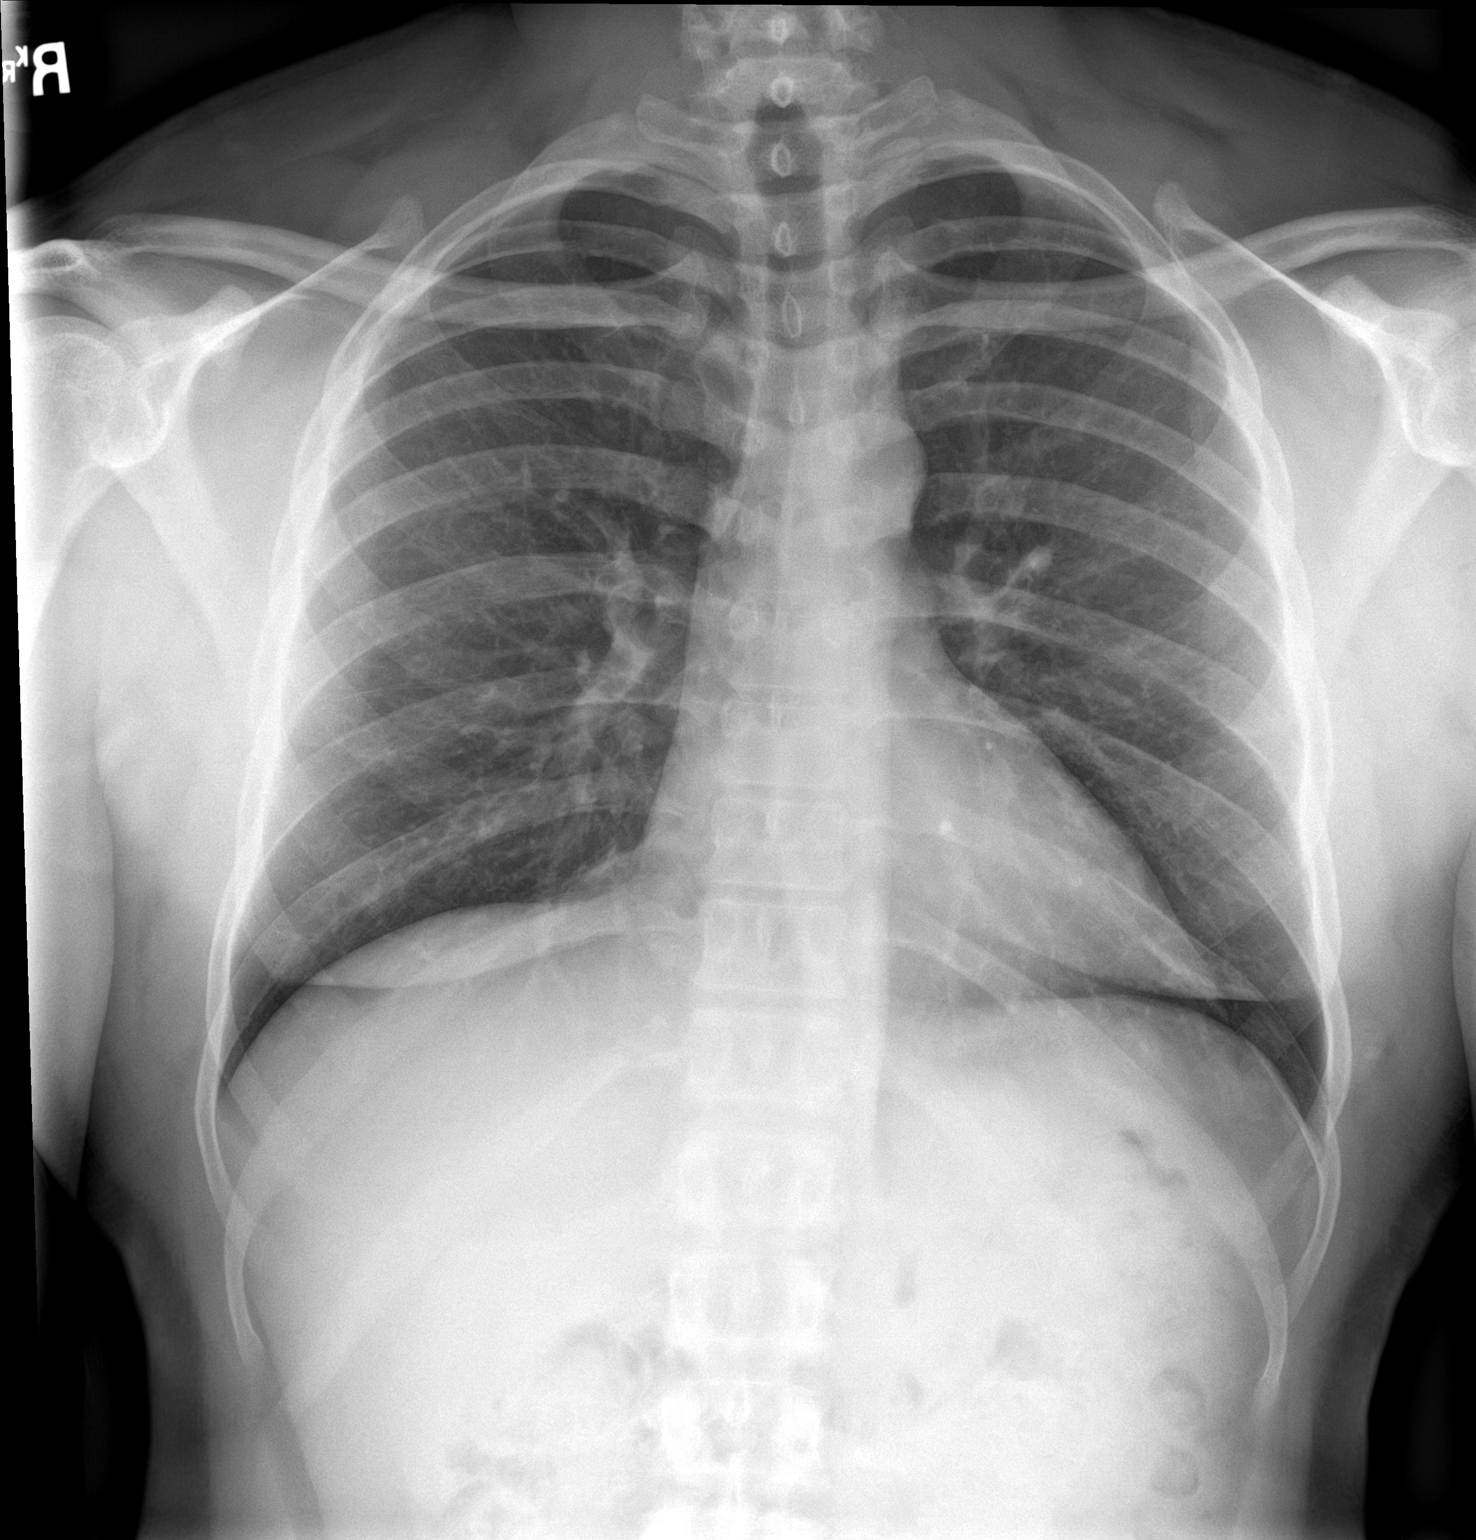

[chest lat]
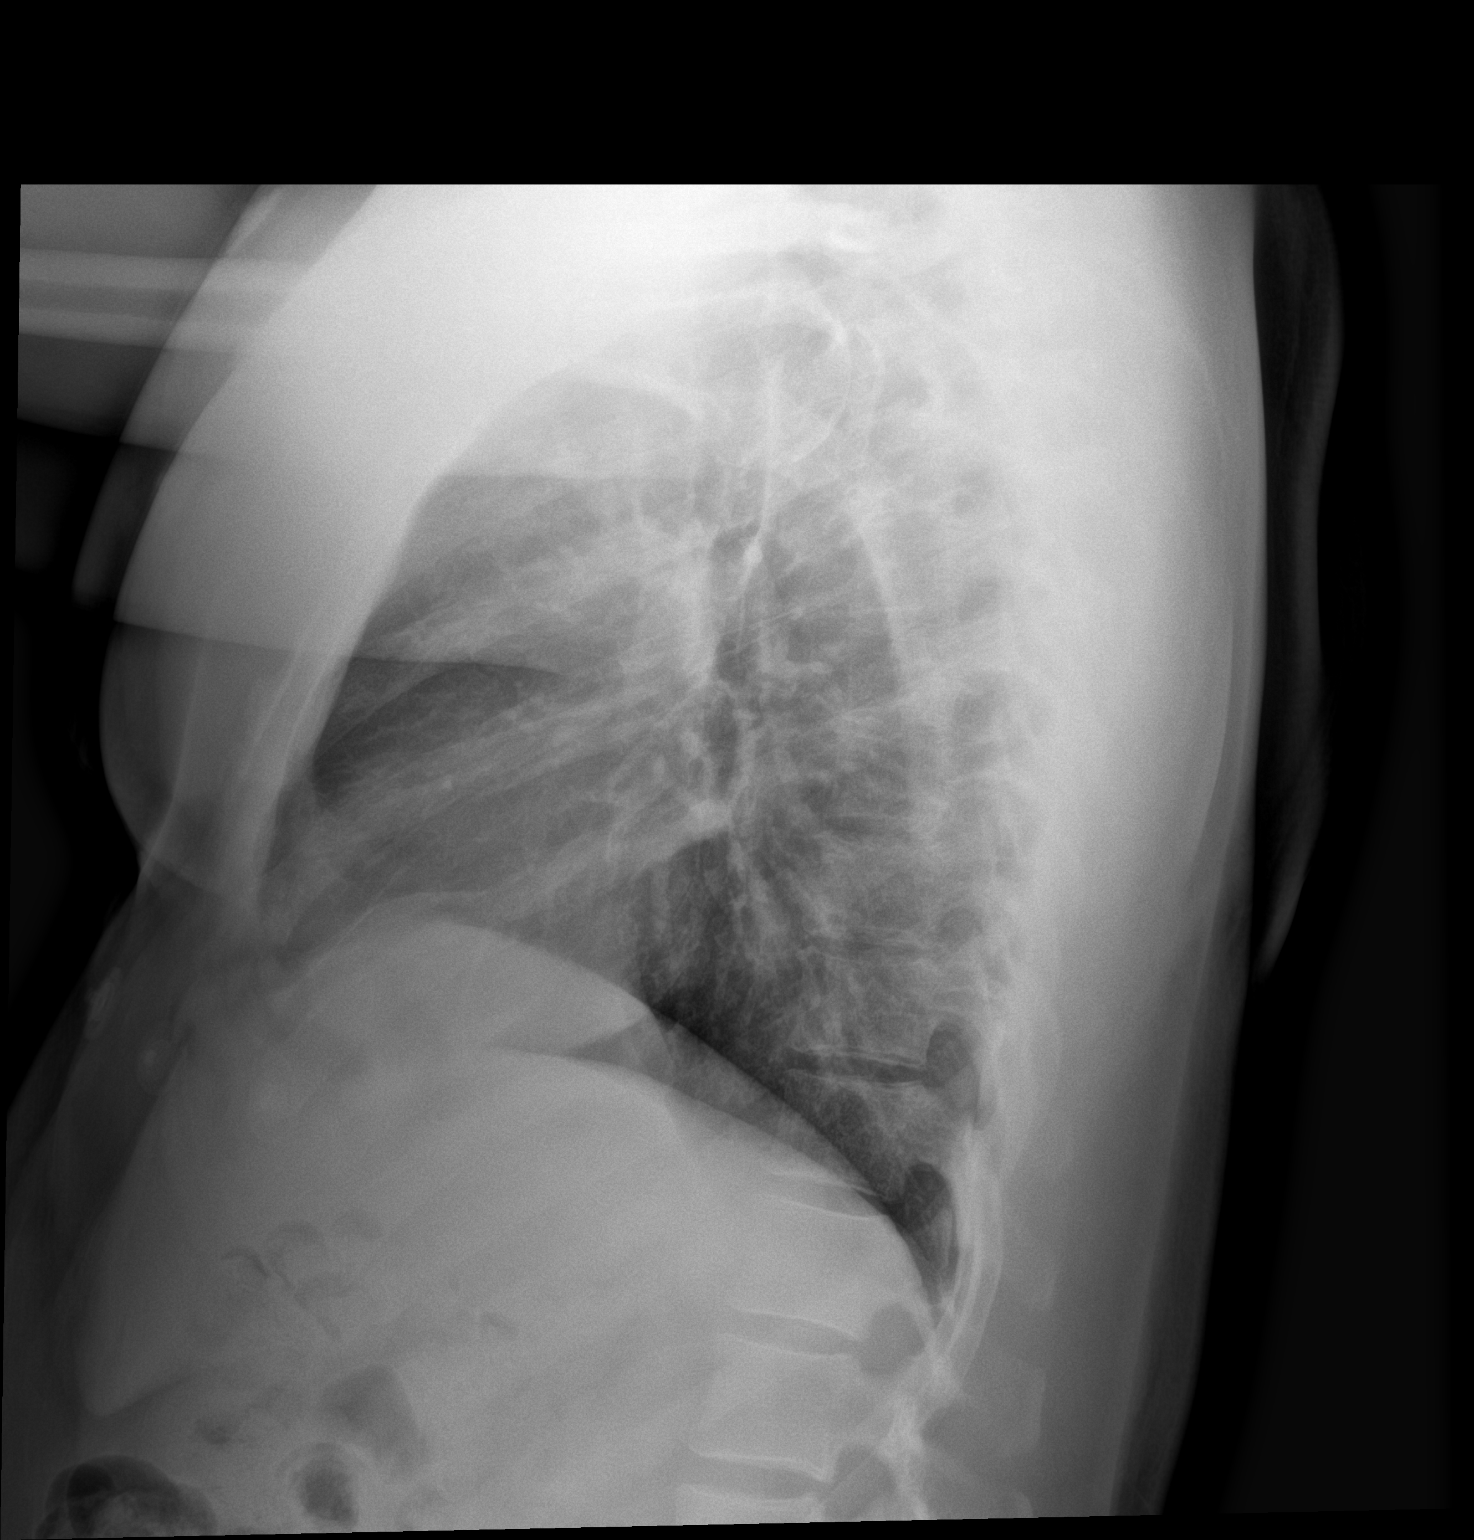

[2 of 2 positions shown; findings below may reference images not displayed]

FINDINGS: Lungs are adequately inflated and otherwise clear. Cardiomediastinal
silhouette and remainder of the exam is unchanged.
IMPRESSION: No active cardiopulmonary disease.

## 2023-02-05 ENCOUNTER — Encounter: Payer: Self-pay | Admitting: Internal Medicine

## 2023-02-05 ENCOUNTER — Ambulatory Visit: Payer: BC Managed Care – PPO | Admitting: Internal Medicine

## 2023-02-05 VITALS — BP 119/74 | HR 70 | Temp 97.7°F | Ht 67.0 in | Wt 161.0 lb

## 2023-02-05 DIAGNOSIS — E663 Overweight: Secondary | ICD-10-CM | POA: Diagnosis not present

## 2023-02-05 DIAGNOSIS — R5383 Other fatigue: Secondary | ICD-10-CM | POA: Diagnosis not present

## 2023-02-05 DIAGNOSIS — R339 Retention of urine, unspecified: Secondary | ICD-10-CM | POA: Insufficient documentation

## 2023-02-05 DIAGNOSIS — Z0001 Encounter for general adult medical examination with abnormal findings: Secondary | ICD-10-CM | POA: Diagnosis not present

## 2023-02-05 DIAGNOSIS — R1900 Intra-abdominal and pelvic swelling, mass and lump, unspecified site: Secondary | ICD-10-CM | POA: Diagnosis not present

## 2023-02-05 DIAGNOSIS — E291 Testicular hypofunction: Secondary | ICD-10-CM | POA: Insufficient documentation

## 2023-02-05 LAB — URINALYSIS, ROUTINE W REFLEX MICROSCOPIC
Bilirubin Urine: NEGATIVE
Hgb urine dipstick: NEGATIVE
Ketones, ur: NEGATIVE
Leukocytes,Ua: NEGATIVE
Nitrite: NEGATIVE
RBC / HPF: NONE SEEN (ref 0–?)
Specific Gravity, Urine: <=1.005 — AB (ref 1.000–1.030)
Total Protein, Urine: NEGATIVE
Urine Glucose: NEGATIVE
Urobilinogen, UA: 0.2 (ref 0.0–1.0)
pH: 7 (ref 5.0–8.0)

## 2023-02-05 NOTE — Assessment & Plan Note (Addendum)
Suspected secondary to Inguinal Hernia: Noted bulge in left inguinal region, exacerbated by coughing. No imaging done yet. No current pain or signs of strangulation. Discussed risks of heavy lifting and potential for strangulation. -Order ultrasound to confirm diagnosis. -Refer to general surgeon for further evaluation and potential surgical repair , deferred by patient because he would like ultrasound confirmation 1t  Urinary Frequency: Reports frequent urination and persistent sensation of needing to urinate after voiding. No urgency or incontinence. Suspected to be related to hernia. -Check urine today to rule out infection. -Expect improvement post hernia repair.    Patient asked how hernia and urinary symptom(s) might be connected.  Through a comprehensive analysis of anonymized patient data, we informed an AI-Generated Report (red):  The Question  how might inguinal hernia affect bladder function. patient feels like he isnt fully emptying bladder and having to pee frequently, no urgency. the main issue is sense of pressure and need to urinate just 5 minutes after urinating, sometimes actually does pee a little more.  Design Strategy  To address this question, we will leverage a comprehensive understanding of the anatomy and pathophysiology of inguinal hernias and their potential impact on bladder function. The strategy involves analyzing the relationship between the hernia and the urinary symptoms described, considering the anatomical proximity of the inguinal canal to the bladder and surrounding structures. We will also consider differential diagnoses that could present with similar urinary symptoms.  Execute Strategy  Upon analyzing the question, the patient's symptoms of feeling like he isn't fully emptying his bladder, frequent urination without urgency, and a sense of pressure suggest a possible mechanical or obstructive influence on bladder function. Each potential cause is considered  against this backdrop:  1. Inguinal Hernia Impact: An inguinal hernia can protrude into the inguinal canal and potentially exert pressure on the bladder or adjacent structures, leading to symptoms of incomplete bladder emptying and frequent urination. The hernia may cause a mechanical obstruction or irritation, contributing to the sensation of pressure and the need to urinate shortly after voiding.  2. Bladder Outlet Obstruction: The hernia could indirectly cause bladder outlet obstruction by compressing the urethra or bladder neck, leading to similar symptoms. This is more likely if the hernia is large or if there is associated swelling or inflammation.  3. Differential Diagnoses: Other conditions that could present with similar symptoms include benign prostatic hyperplasia (BPH), urinary tract infection (UTI), or neurogenic bladder. However, the absence of urgency and the presence of a known inguinal hernia make the hernia a more likely primary cause.  Systematically Ensure Accuracy & Precision  Re-evaluating the information, the patient's symptoms align with the potential impact of an inguinal hernia on bladder function. The mechanical pressure exerted by the hernia on the bladder or urethra can explain the sensation of incomplete emptying, frequent urination, and the sense of pressure. It is important to consider that while the hernia is a likely cause, a thorough clinical evaluation is necessary to rule out other potential contributing factors.  Final Answer  An inguinal hernia can affect bladder function by exerting mechanical pressure on the bladder or adjacent structures, leading to symptoms such as a feeling of incomplete bladder emptying, frequent urination without urgency, and a sense of pressure. This pressure can cause a partial obstruction or irritation, resulting in the need to urinate shortly after voiding and sometimes actually urinating a little more.

## 2023-02-05 NOTE — Patient Instructions (Addendum)
VISIT SUMMARY:  During your visit, we discussed your concerns about a bulge in your left upper pelvic region and increased urinary frequency. The bulge, which you believe may be a hernia, is always present and becomes more noticeable when you cough. You have not experienced any pain or discomfort from this. Your increased urinary frequency, despite drinking enough fluids, is causing you to feel like your bladder is always full. You also mentioned feeling like you haven't fully emptied your bladder after urinating. We suspect these urinary symptoms may be related to the possible hernia.  YOUR PLAN:  -SUSPECTED HERNIA: A hernia is a condition where an organ pushes through an opening in the muscle or tissue that holds it in place. In your case, we suspect you may have an inguinal hernia, which is when part of your intestine pushes through a weak spot in your lower abdominal wall. We will order an ultrasound to confirm this diagnosis and refer you to a general surgeon for further evaluation and potential surgical repair.  -URINARY FREQUENCY: Urinary frequency is when you need to urinate more often than usual. It can be caused by a variety of conditions, but in your case, we suspect it may be related to the possible hernia. We will check your urine today to rule out any infection and expect that your symptoms will improve after the hernia is repaired, if confirmed.    INSTRUCTIONS:  We will complete a wellness visit today and order routine blood work to be done within the week. Once the results of your ultrasound are available, we will communicate with you via MyChart and refer you to a surgeon if a hernia is confirmed. We will follow up with you in a little over a year for a general check-up.  Welcome aboard!   Today's visit was a valuable first step in understanding your health and starting your personalized care journey. We discussed your medical history and medications in detail. Given the extensive  information, we prioritized addressing your most pressing concerns.  We understood those concerns to be:  New Patient (Initial Visit), Hernia, and Urinary Retention (Feels pressure to urinate a few minutes after urinating.)   Building a Complete Picture  To create the most effective care plan possible, we may need additional information from previous providers. We encouraged you to gather any relevant medical records for your next visit. This will help [place] build a more complete picture and develop a personalized plan together. In the meantime, we'll address your immediate concerns and provide resources to help you manage all of your medical issues.  We encourage you to use MyChart to review these efforts, and to help [place] find and correct any omissions or errors in your medical chart.  Managing Your Health Over Time  Managing every aspect of your health in a single visit isn't always feasible, but that's okay.  We addressed your most pressing concerns today and charted a course for future care. Acute conditions or preventive care measures may require further attention.  We encourage you to schedule a follow-up visit at your earliest convenience to discuss any unresolved issues.  We strongly encourage participation in annual preventive care visits to help [place] develop a more thorough understanding of your health and to help you maintain optimal wellness - please inquire about scheduling your next one with [place] at your earliest convenience.  Your Satisfaction Matters  It was a pleasure seeing you today!  Your health and satisfaction will always be my top priorities. If  you believe your experience today was worthy of a 5-star rating, I'd be grateful for your feedback!  Lula Olszewski, MD  Next Steps  Schedule Follow-Up:  We recommend a follow-up appointment in 1 year for your next wellness visit.  If you develop any new problems, want to address any medical issues, or your condition worsens before then,  please call us for an appointment or seek emergency care. Preventive Care:  Don't forget to schedule your annual preventive care visit!  Please review your attached preventive care information. Make sure to arrange appointments for dental and vision routine screening, and use nightly nasal saline mist to keep your sinuses clear. Medical Information Release:  For any relevant medical information we don't have, please sign a release form at the front desk so we can obtain it for your records. Lab & X-ray Appointments:  Scheduled any incomplete lab tests today or call us to schedule.  X-Rays can be done without an appointment at [facility] at [facility] ([address], [address]), M-F 8:30am-noon or 1pm-5pm.  Just tell them you're there for X-rays ordered by Dr. Jon Billings.  We'll receive the results and contact you by phone or MyChart to discuss next steps.  Making the Most of Our Focused (20 minute) Appointments:  [x]   Clearly state your top concerns at the beginning of the visit to focus our discussion [x]   If you anticipate you will need more time, please inform the front desk during scheduling - we can book multiple appointments in the same week. [x]   If you have transportation problems- use our convenient video appointments or ask about transportation support. [x]   We can get down to business faster if you use MyChart to update information before the visit and submit non-urgent questions before your visit. Thank you for taking the time to provide details through MyChart.  Let our nurse know and she can import this information into your encounter documents.  Arrival and Wait Times: [x]   Arriving on time ensures that everyone receives prompt attention. [x]   Early morning (8a) and afternoon (1p) appointments tend to have shortest wait times. [x]   Unfortunately, we cannot delay appointments for late arrivals or hold slots during phone calls.  Thank you for collaborating with [place] to prioritize your  health. We look forward to serving you!  Bring to Your Next Appointment  Medications: Please bring all your medication bottles to your next appointment to ensure we have an accurate record of your prescriptions. Health Diaries: If you're monitoring any health conditions at home, keeping a diary of your readings can be very helpful for discussions at your next appointment.  Reviewing Your Records  Please Review this early draft of your clinical notes below and the final encounter summary tomorrow on MyChart after its been completed.   Hypogonadism in male  Incomplete emptying of bladder Assessment & Plan: Through a comprehensive analysis of anonymized patient data, we informed an AI-generated report. All AI suggestions underwent careful evaluation against established guidelines to ensure optimal patient care. During our collaborative review of the report's findings, we discussed potential diagnoses, recommended tests, treatment options, and next steps. I explained the rationale behind the AI recommendations, integrating my clinical judgment for the patient's understanding.  In collaboration with the patient, we explored the potential benefits and risks associated with certain categories of AI-suggested interventions. For example, we discussed the advantages and considerations of more aggressive treatment options. Ultimately, the patient elected to closely monitor the situation based on their  preferences and our shared understanding of their condition. Patient's best interests and evidence-based practices guided all decisions, respecting their autonomy. Moving forward, we will continue close monitoring and revisit these options at the next visit or sooner if their condition changes. The patient was encouraged to keep me informed of any developments."  AI-Generated Report (red):    The Question  how might inguinal hernia affect bladder function. patient feels like he isnt fully emptying bladder  and having to pee frequently, no urgency. the main issue is sense of pressure and need to urinate just 5 minutes after urinating, sometimes actually does pee a little more.  Design Strategy  To address this question, we will leverage a comprehensive understanding of the anatomy and pathophysiology of inguinal hernias and their potential impact on bladder function. The strategy involves analyzing the relationship between the hernia and the urinary symptoms described, considering the anatomical proximity of the inguinal canal to the bladder and surrounding structures. We will also consider differential diagnoses that could present with similar urinary symptoms.  Execute Strategy  Upon analyzing the question, the patient's symptoms of feeling like he isn't fully emptying his bladder, frequent urination without urgency, and a sense of pressure suggest a possible mechanical or obstructive influence on bladder function. Each potential cause is considered against this backdrop:  1. Inguinal Hernia Impact: An inguinal hernia can protrude into the inguinal canal and potentially exert pressure on the bladder or adjacent structures, leading to symptoms of incomplete bladder emptying and frequent urination. The hernia may cause a mechanical obstruction or irritation, contributing to the sensation of pressure and the need to urinate shortly after voiding.  2. Bladder Outlet Obstruction: The hernia could indirectly cause bladder outlet obstruction by compressing the urethra or bladder neck, leading to similar symptoms. This is more likely if the hernia is large or if there is associated swelling or inflammation.  3. Differential Diagnoses: Other conditions that could present with similar symptoms include benign prostatic hyperplasia (BPH), urinary tract infection (UTI), or neurogenic bladder. However, the absence of urgency and the presence of a known inguinal hernia make the hernia a more likely primary  cause.  Systematically Ensure Accuracy & Precision  Re-evaluating the information, the patient's symptoms align with the potential impact of an inguinal hernia on bladder function. The mechanical pressure exerted by the hernia on the bladder or urethra can explain the sensation of incomplete emptying, frequent urination, and the sense of pressure. It is important to consider that while the hernia is a likely cause, a thorough clinical evaluation is necessary to rule out other potential contributing factors.  Final Answer  An inguinal hernia can affect bladder function by exerting mechanical pressure on the bladder or adjacent structures, leading to symptoms such as a feeling of incomplete bladder emptying, frequent urination without urgency, and a sense of pressure. This pressure can cause a partial obstruction or irritation, resulting in the need to urinate shortly after voiding and sometimes actually urinating a little more.  Orders: -     Urinalysis, Routine w reflex microscopic -     CBC; Future -     Differential; Future -     Comprehensive metabolic panel; Future  Overweight -     TSH; Future -     Lipid panel; Future  Abdominal swelling -     US Abdomen Limited; Future  Other fatigue     Getting Answers and Following Up  Simple Questions & Concerns: For quick questions or basic follow-up after  your visit, reach [place] at [phone] or MyChart messaging. Complex Concerns: If your concern is more complex, scheduling an appointment might be best. Discuss this with the staff to find the most suitable option. Lab & Imaging Results: We'll contact you directly if results are abnormal or you don't use MyChart. Most normal results will be on MyChart within 2-3 business days, with a review message from Dr. Jon Billings. Haven't heard back in 2 weeks? Need results sooner? Contact us at [phone]. Referrals: Our referral coordinator will manage specialist referrals. The specialist's  office should contact you within 2 weeks to schedule an appointment. Call us if you haven't heard from them after 2 weeks.  Staying Connected  MyChart: Activate your MyChart for the fastest way to access results and message [place]. See the last page of this paperwork for instructions on how to activate.  Billing  X-ray & Lab Orders: These are billed by separate companies. Contact the invoicing company directly for questions or concerns. Visit Charges: Discuss any billing inquiries with our administrative services team.  Feedback & Satisfaction  Share Your Experience: We strive for your satisfaction! If you have any complaints, or preferably compliments, please let Dr. Jon Billings know directly or contact our Practice Administrators, Edwena Felty or [employer], by asking at the front desk.   Scheduling Tips  Shorter Wait Times: 8 am and 1 pm appointments often have the quickest wait times. Longer Appointments: If you need more time during your visit, talk to the front desk. Due to insurance regulations, multiple back-to-back appointments might be necessary.

## 2023-02-05 NOTE — Progress Notes (Signed)
Adult nurse [facility] at [facility]: [phone] Patient Care Team: Paschal Dopp, PA as PCP - General (Physician Assistant) Today's Healthcare Provider: Lula Olszewski, MD  Chief Complaint:  Mario Cain is a 47 y.o. assigned male at birth who presents today for his New Patient (Initial Visit), Hernia, and Urinary Retention (Feels pressure to urinate a few minutes after urinating.)  Assessment/Plan:   Abdominal swelling Assessment & Plan: Inguinal Hernia: Noted bulge in left inguinal region, exacerbated by coughing. No imaging done yet. No current pain or signs of strangulation. Discussed risks of heavy lifting and potential for strangulation. -Order ultrasound to confirm diagnosis. -Refer to general surgeon for further evaluation and potential surgical repair.  Orders: -     US Abdomen Limited; Future  Hypogonadism in male  Incomplete emptying of bladder Assessment & Plan: Suspected secondary to Inguinal Hernia: Noted bulge in left inguinal region, exacerbated by coughing. No imaging done yet. No current pain or signs of strangulation. Discussed risks of heavy lifting and potential for strangulation. -Order ultrasound to confirm diagnosis. -Refer to general surgeon for further evaluation and potential surgical repair , deferred by patient because he would like ultrasound confirmation 1t  Urinary Frequency: Reports frequent urination and persistent sensation of needing to urinate after voiding. No urgency or incontinence. Suspected to be related to hernia. -Check urine today to rule out infection. -Expect improvement post hernia repair.    Patient asked how hernia and urinary symptom(s) might be connected.  Through a comprehensive analysis of anonymized patient data, we informed an AI-Generated Report (red):  The Question  how might inguinal hernia affect bladder function. patient feels like he isnt fully emptying bladder and having to pee frequently, no urgency.  the main issue is sense of pressure and need to urinate just 5 minutes after urinating, sometimes actually does pee a little more.  Design Strategy  To address this question, we will leverage a comprehensive understanding of the anatomy and pathophysiology of inguinal hernias and their potential impact on bladder function. The strategy involves analyzing the relationship between the hernia and the urinary symptoms described, considering the anatomical proximity of the inguinal canal to the bladder and surrounding structures. We will also consider differential diagnoses that could present with similar urinary symptoms.  Execute Strategy  Upon analyzing the question, the patient's symptoms of feeling like he isn't fully emptying his bladder, frequent urination without urgency, and a sense of pressure suggest a possible mechanical or obstructive influence on bladder function. Each potential cause is considered against this backdrop:  1. Inguinal Hernia Impact: An inguinal hernia can protrude into the inguinal canal and potentially exert pressure on the bladder or adjacent structures, leading to symptoms of incomplete bladder emptying and frequent urination. The hernia may cause a mechanical obstruction or irritation, contributing to the sensation of pressure and the need to urinate shortly after voiding.  2. Bladder Outlet Obstruction: The hernia could indirectly cause bladder outlet obstruction by compressing the urethra or bladder neck, leading to similar symptoms. This is more likely if the hernia is large or if there is associated swelling or inflammation.  3. Differential Diagnoses: Other conditions that could present with similar symptoms include benign prostatic hyperplasia (BPH), urinary tract infection (UTI), or neurogenic bladder. However, the absence of urgency and the presence of a known inguinal hernia make the hernia a more likely primary cause.  Systematically Ensure Accuracy &  Precision  Re-evaluating the information, the patient's symptoms align with the potential impact of an  inguinal hernia on bladder function. The mechanical pressure exerted by the hernia on the bladder or urethra can explain the sensation of incomplete emptying, frequent urination, and the sense of pressure. It is important to consider that while the hernia is a likely cause, a thorough clinical evaluation is necessary to rule out other potential contributing factors.  Final Answer  An inguinal hernia can affect bladder function by exerting mechanical pressure on the bladder or adjacent structures, leading to symptoms such as a feeling of incomplete bladder emptying, frequent urination without urgency, and a sense of pressure. This pressure can cause a partial obstruction or irritation, resulting in the need to urinate shortly after voiding and sometimes actually urinating a little more.  Orders: -     Urinalysis, Routine w reflex microscopic -     CBC; Future -     Differential; Future -     Comprehensive metabolic panel; Future  Overweight -     TSH; Future -     Lipid panel; Future  Other fatigue   Today's Health Maintenance Counseling and Anticipatory Guidance:   Eye exams:  every 1-2 years recommended.  Having vision corrected can improve the quality of day-to-day life.  Eye specialists can detect certain eye conditions such as cataracts, glaucoma and age-related macular degeneration, which could lead to sight loss.  They can also detect certain rare cancers and diabetes, among other things.  [name] reports his last eye exam was:  does them but had LASIK, late last year(s)  Dental health: Discussed importance of regular tooth brushing, flossing, and dental visits q6 months.  Poor dentition can lead to serious medical problems - particularly problems with heart valves.  [name] reports he has been keeping up with his dental visits.  He intends to do so in the future.  Sinus health:  Encourage sterile saline nasal misting sinus rinses daily for pollen, to reduce allergies and risk for sinus infections.   Sterile can based misting products are recommended due to superior misting and ease of maintaining sterility. Sleep Apnea screening:  He  denies any significant problems with sleep quality or hypersomnolence or being advised that he has been told that he snores or has apnea STOP-Bang Score Do you snore loudly?: No Do you often feel tired, fatigued, or sleepy during the daytime?: yes but more work related Has anyone observed you stop breathing during sleep?: No Do you have (or are you being treated for) high blood pressure?: No BMI    Body Mass Index: 25.22 kg/m    @ Is BMI greater than 35 kg/m2?:  No Age older than 48 years old?: No Has large neck size > 40 cm (15.7 in, large male shirt size, large male collar size > 16): No Gender Male?:  Yes STOP-Bang Total Score(total yes answers):  1.5  Cardiovascular Risk Factor Reduction:   Advised patient of need for regular exercise and diet rich and fruits and vegetables and healthy fats to reduce risk of heart attack and stroke.  Avoid first- and second-hand smoke and stimulants.   Avoid extreme exercise- exercise in moderation (150 minutes per week is a good goal) Wt Readings from Last 3 Encounters:  02/05/23 161 lb (73 kg)  02/13/22 164 lb 14.5 oz (74.8 kg)  04/23/21 165 lb (74.8 kg)   Body mass index is 25.22 kg/m. /  He reports his diet consists of pretty healthy, lots of veggies and lean meats, eats a lot of fish He reports his exercise includes of  lots of resistance training. Health maintenance and immunizations reviewed and he was encouraged to complete anything that is due:  There is no immunization history on file for this patient. Health Maintenance Due  Topic Date Due   Colonoscopy  Never done   Had screening colonoscopy last year(s), will get [place] records, was with [facility]. Sexual transmitted infection  screening: testing offered today, but patient declined as he feels he is low risk based on his sexual history married.   Substance use:  I discussed that my recommendation is total abstinence from all substances of abuse including smoke and 2nd hand smoke, alcohol, illicit drugs, smoking, inhalants, sugar.   Offered to assist with any use disorders or addictions.   Injury prevention: Discussed safety belts, safety helmets, smoke detectors. Cancer Screening: Penile cancer screening: Asked about genital warts or tumors/abnormalities of penis. Recommended Gardasil if none present, or cryo ablation if present Testicular cancer screening:  Patient was advised to palpate testicles, scrotum, penis for masses and inform me of any.   Thyroid cancer screening: patient advised to check by palpating thyroid for nodules Prostate cancer screening:  Denies family history of prostate cancer or hematospermia so too young for screening by current guidelines. No results found for: "PSA"     Colon cancer screening:   Denies strong family history of colon cancer or blood in stool so no screening is indicated until age 61.   Lung cancer screening:  reports he is never smoker Skin cancer screening-  Advised regular sunscreen use. He denies worrisome, changing, or new skin lesions. Showed him pictures of melanomas for reference:    General Health Maintenance / Followup Plans: -Complete wellness visit today. -Order routine blood work to be done within the week. -Follow up in a little over a year.  for next preventative visit.  -Once ultrasound results are available, will communicate via MyChart and refer to surgeon if confirmed hernia.         Subjective:   See problem-oriented charting in (overview sections of assessment & plan) for updated chart information added to chronic problems for future tracking  He has no acute complaints today. Just hernia and urinary issues.  Discussed the use of AI scribe software  for clinical note transcription with the patient, who gave verbal consent to proceed.  History of Present Illness   The patient, an established 47 year old individual, presents with two primary concerns: a suspected hernia and urinary frequency. The patient reports a bulge in the left upper pelvic region, which was previously evaluated by a surgeon at another facility. The patient has not undergone any imaging studies for this issue. The bulge is constantly present and becomes more pronounced with coughing. The patient has not experienced any associated pain or discomfort.  In addition to the suspected hernia, the patient reports increased urinary frequency. Despite adequate hydration, the patient experiences a persistent sensation of bladder fullness and pressure, even shortly after urination. The patient does not report any urgency or need to rush to the bathroom, but notes a sensation of incomplete bladder emptying and occasional additional urination shortly after the initial void. The patient denies any history of urinary tract infections or significant prostate issues.  The patient has a history of heavy lifting and resistance training, which he suspects may have contributed to the development of the hernia. He has not engaged in heavy lifting since the birth of his child in June. The patient maintains a healthy diet, rich in vegetables, lean meats, and fish, and  has a regular exercise routine. He denies any significant smoking history.  The patient's family history is notable for a maternal history of breast cancer and a paternal history of stroke, attributed to heavy smoking. The patient reports no known family history of hernias or urinary issues. The patient is currently employed in a sedentary job and acknowledges the need for regular stretching to alleviate muscle tightness and lower back discomfort.  In summary, the patient presents with a suspected hernia and urinary frequency, likely related  to the hernia. He is otherwise in good health, with a healthy lifestyle and no significant risk factors for common chronic diseases. Further diagnostic evaluation is needed to confirm the hernia and assess its impact on the patient's urinary symptoms.       Review of Systems  Constitutional:  Negative for chills, diaphoresis, fever, malaise/fatigue and weight loss.  HENT:  Negative for congestion, ear discharge, ear pain, hearing loss, nosebleeds, sinus pain, sore throat and tinnitus.   Eyes:  Negative for blurred vision, double vision, photophobia, pain, discharge and redness.  Respiratory:  Negative for cough, hemoptysis, sputum production, shortness of breath, wheezing and stridor.   Cardiovascular:  Negative for chest pain, palpitations, orthopnea, claudication, leg swelling and PND.  Gastrointestinal:  Negative for abdominal pain, blood in stool, constipation, diarrhea, heartburn, melena, nausea and vomiting.  Genitourinary:  Negative for dysuria, flank pain, frequency, hematuria and urgency.  Musculoskeletal:  Negative for back pain, falls, joint pain, myalgias and neck pain.  Skin:  Negative for itching and rash.  Neurological:  Negative for dizziness, tingling, tremors, sensory change, speech change, focal weakness, seizures, loss of consciousness, weakness and headaches.  Endo/Heme/Allergies:  Negative for environmental allergies and polydipsia. Does not bruise/bleed easily.  Psychiatric/Behavioral:  Negative for depression, hallucinations, memory loss, substance abuse and suicidal ideas. The patient is not nervous/anxious and does not have insomnia.     During today's preventive visit, the patient has expressed that any positive findings in the review of systems are related to chronic conditions that are stable.  The patient has requested that these issues not be addressed in this visit, with the understanding that they are part of ongoing management and do not require additional workup  at this time.   Prior to collecting ROS, patient was informed that urgent issues will be addressed even if they require a separate billable visit that was not initially agreed upon, because reporting such issues can create medical liability if ignored.  I attest that I have reviewed and confirmed the patients current medications to meet the medication reconciliation requirement  Current Outpatient Medications  Medication Sig Dispense Refill   clomiPHENE Citrate (CLOMID PO) Take 25 mg by mouth daily.     COD LIVER OIL PO Take 1,000 mg by mouth daily.     Multiple Vitamin (MULTIVITAMIN PO) Take 2 each by mouth daily. Gummies.     No current facility-administered medications for this visit.    The following were reviewed and entered/updated in epic:    02/05/2023    9:10 AM  Depression screen PHQ 2/9  Decreased Interest 0  Down, Depressed, Hopeless 0  PHQ - 2 Score 0   Past Medical History:  Diagnosis Date   Allergy    High cholesterol    Patient Active Problem List   Diagnosis Date Noted   Hypogonadism in male 02/05/2023   Incomplete emptying of bladder 02/05/2023   Fatigue 02/05/2023   Abdominal swelling 02/05/2023   Past Surgical History:  Procedure  Laterality Date   arthritis     Family History  Problem Relation Age of Onset   Cancer Mother    Hyperlipidemia Mother    Diabetes Maternal Grandmother    Hypertension Maternal Grandfather    Hyperlipidemia Maternal Grandfather    Diabetes Maternal Grandfather    Allergies  Allergen Reactions   Egg-Derived Products    Other     Various pollen and trees.   Social History   Tobacco Use   Smoking status: Former    Types: Cigars   Smokeless tobacco: Never  Vaping Use   Vaping status: Never Used  Substance Use Topics   Alcohol use: Not Currently    Comment: socially   Drug use: No     Objective:  Physical Exam: BP 119/74 (BP Location: Right Arm, Patient Position: Sitting)   Pulse 70   Temp 97.7 F (36.5 C)  (Temporal)   Ht 5\' 7"  (1.702 m)   Wt 161 lb (73 kg)   SpO2 97%   BMI 25.22 kg/m   Body mass index is 25.22 kg/m. Wt Readings from Last 3 Encounters:  02/05/23 161 lb (73 kg)  02/13/22 164 lb 14.5 oz (74.8 kg)  04/23/21 165 lb (74.8 kg)   GEN: NAD, resting comfortably, muscular physique explains elevated BMI, no truncal adiposity. HEENT: Tympanic membranes normal appearing bilaterally, Oropharynx clear. No thyromegaly noted. No palpable lymphadenopathy or thyroid nodules. CARDIOVASCULAR: S1 and S2 heart sounds have regular rate and rhythm with no murmurs appreciated PULMONARY:  Normal work of breathing. Clear to auscultation bilaterally with no crackles, wheezes, or rhonchi ABDOMEN: Soft, Nontender, Nondistended.  MSK: No edema, cyanosis, or clubbing noted  SKIN: Warm, dry, no lesions of concern observed NEURO: CN2-12 grossly intact. Strength 5/5 in upper and lower extremities. Reflexes symmetric and intact bilaterally.  PSYCH: Normal affect and thought content, pleasant and cooperative.

## 2023-02-05 NOTE — Assessment & Plan Note (Signed)
Inguinal Hernia: Noted bulge in left inguinal region, exacerbated by coughing. No imaging done yet. No current pain or signs of strangulation. Discussed risks of heavy lifting and potential for strangulation. -Order ultrasound to confirm diagnosis. -Refer to general surgeon for further evaluation and potential surgical repair.

## 2023-02-06 ENCOUNTER — Other Ambulatory Visit: Payer: BC Managed Care – PPO

## 2023-02-06 DIAGNOSIS — E663 Overweight: Secondary | ICD-10-CM

## 2023-02-06 DIAGNOSIS — R339 Retention of urine, unspecified: Secondary | ICD-10-CM

## 2023-02-06 LAB — CBC
HCT: 47.4 % (ref 39.0–52.0)
Hemoglobin: 16.1 g/dL (ref 13.0–17.0)
MCHC: 34 g/dL (ref 30.0–36.0)
MCV: 88.4 fl (ref 78.0–100.0)
Platelets: 264 10*3/uL (ref 150.0–400.0)
RBC: 5.36 Mil/uL (ref 4.22–5.81)
RDW: 13.3 % (ref 11.5–15.5)
WBC: 4.6 10*3/uL (ref 4.0–10.5)

## 2023-02-06 LAB — COMPREHENSIVE METABOLIC PANEL
ALT: 27 U/L (ref 0–53)
AST: 17 U/L (ref 0–37)
Albumin: 4.5 g/dL (ref 3.5–5.2)
Alkaline Phosphatase: 52 U/L (ref 39–117)
BUN: 14 mg/dL (ref 6–23)
CO2: 26 mEq/L (ref 19–32)
Calcium: 9.7 mg/dL (ref 8.4–10.5)
Chloride: 102 mEq/L (ref 96–112)
Creatinine, Ser: 1.06 mg/dL (ref 0.40–1.50)
GFR: 83.51 mL/min (ref >60.00)
Glucose, Bld: 113 mg/dL — ABNORMAL HIGH (ref 70–99)
Potassium: 3.8 mEq/L (ref 3.5–5.1)
Sodium: 136 mEq/L (ref 135–145)
Total Bilirubin: 0.6 mg/dL (ref 0.2–1.2)
Total Protein: 7.4 g/dL (ref 6.0–8.3)

## 2023-02-06 LAB — TSH: TSH: 2.28 u[IU]/mL (ref 0.35–5.50)

## 2023-02-06 LAB — LIPID PANEL
Cholesterol: 240 mg/dL — ABNORMAL HIGH (ref 0–200)
HDL: 31.1 mg/dL — ABNORMAL LOW (ref >39.00)
LDL Cholesterol: 173 mg/dL — ABNORMAL HIGH (ref 0–99)
NonHDL: 209.28
Total CHOL/HDL Ratio: 8
Triglycerides: 183 mg/dL — ABNORMAL HIGH (ref 0.0–149.0)
VLDL: 36.6 mg/dL (ref 0.0–40.0)

## 2023-02-06 NOTE — Progress Notes (Signed)
I've reviewed the results and they are all essentially normal, except for cholesterol levels.  HDL Cholesterol was 31.10 8: this is good cholesterol we want to raise the levels as high as possible.  The longest lived individuals typically have levels over 100. LDL cholesterol levels were LDLcalc 173 this is bad cholesterol that forms plaques in the arteries that cause strokes and heart attacks, we want the levels as low as possible.  People with history of cardiovascular disease take medicine to get to a goal of 50, but lower might be even better. Triglycerides Levels were 183.0 these contribute to cardiovascular disease and gallstones, we want the levels as low as possible.  Low carb diets and weight loss are very effective at keeping this low.  While we'll discuss the details at your next appointment, we wanted to share some initial thoughts and next steps.  Diet: Focus on avoiding foods that have saturated fats, high cholesterol, or high levels of carbohydrates like sugar, starch, and grains.  Even more important, be sure to eat lots of healthy omega-3 unsaturated fats like Extra Virgin Olive Oil, fatty fish, nuts, and avocados.  Its safe to take omega-3 supplements. Exercise: Regular physical activity is crucial for overall health and can benefit your cholesterol. Weight Management: If you're overweight or obese, reaching a healthy weight can positively impact your cholesterol. Potential Medication: Depending on your individual risk factors, we might explore the benefits of medications to further manage your cholesterol and heart health.  We'll discuss this in detail at your next visit.  Collaboration is Key: We value your active participation in your care plan. At your next appointment, we can discuss your questions and preferences to create a personalized approach that works best for you.  Next Steps: We encourage you to make any suggested lifestyle changes as described above We encourage you  to get cholesterol rechecked in 6-12 months If you're interested, we could have an extra appointment to: (1) Review your cholesterol results in more detail. (2) Discuss personalized lifestyle changes and potential medication options. (3) Answer your questions and address any concerns you may have. (4) Discuss additional cholesterol testing options and cardiac risk evaluation tests that are available  In the meantime, you can find some resources on heart-healthy living at [email or web address] Please let us know if you have any questions before your next appointment.

## 2023-02-08 ENCOUNTER — Encounter: Payer: Self-pay | Admitting: Internal Medicine

## 2023-02-08 DIAGNOSIS — K648 Other hemorrhoids: Secondary | ICD-10-CM | POA: Insufficient documentation

## 2023-02-12 DIAGNOSIS — Z79890 Hormone replacement therapy (postmenopausal): Secondary | ICD-10-CM | POA: Diagnosis not present

## 2023-02-12 DIAGNOSIS — E291 Testicular hypofunction: Secondary | ICD-10-CM | POA: Diagnosis not present

## 2023-02-14 ENCOUNTER — Ambulatory Visit: Payer: Self-pay | Admitting: Internal Medicine

## 2023-02-14 DIAGNOSIS — R6882 Decreased libido: Secondary | ICD-10-CM | POA: Diagnosis not present

## 2023-02-14 DIAGNOSIS — E291 Testicular hypofunction: Secondary | ICD-10-CM | POA: Diagnosis not present

## 2023-02-14 DIAGNOSIS — Z6825 Body mass index (BMI) 25.0-25.9, adult: Secondary | ICD-10-CM | POA: Diagnosis not present

## 2023-02-14 DIAGNOSIS — M2550 Pain in unspecified joint: Secondary | ICD-10-CM | POA: Diagnosis not present

## 2023-02-20 ENCOUNTER — Encounter: Payer: Self-pay | Admitting: Internal Medicine

## 2023-02-20 ENCOUNTER — Ambulatory Visit: Payer: BC Managed Care – PPO | Admitting: Internal Medicine

## 2023-02-20 VITALS — BP 108/74 | HR 85 | Temp 98.1°F | Ht 67.0 in | Wt 159.6 lb

## 2023-02-20 DIAGNOSIS — R339 Retention of urine, unspecified: Secondary | ICD-10-CM

## 2023-02-20 DIAGNOSIS — E785 Hyperlipidemia, unspecified: Secondary | ICD-10-CM | POA: Insufficient documentation

## 2023-02-20 DIAGNOSIS — E782 Mixed hyperlipidemia: Secondary | ICD-10-CM | POA: Diagnosis not present

## 2023-02-20 DIAGNOSIS — R1900 Intra-abdominal and pelvic swelling, mass and lump, unspecified site: Secondary | ICD-10-CM | POA: Diagnosis not present

## 2023-02-20 NOTE — Assessment & Plan Note (Addendum)
Hyperlipidemia: With a low HDL cholesterol level of 31.1 and a family history of cardiovascular disease, we discussed the importance of diet and lifestyle modifications, including incorporating extra virgin olive oil, avocados, and fish oil supplements. We will continue these lifestyle modifications, focusing on diet and exercise, and increase fish oil intake to 4 grams per day. We will also consider starting low-dose rosuvastatin after rechecking cholesterol in 6 months, discussing the potential benefits and risks.  Recommended fish oil, try to get to 4 grams daily   Gave list of diet adjustments: I'll provide a list of healthy and unhealthy food options to help address the cholesterol levels you've mentioned. The goal is to raise HDL (good cholesterol) and lower LDL (bad cholesterol). Healthiest options:  Fatty fish (salmon, mackerel, sardines, trout) Nuts (almonds, walnuts, pecans) Seeds (chia, flax, pumpkin) Avocados Olive oil and olives Whole grains (oats, quinoa, brown rice) Legumes (beans, lentils, chickpeas) Leafy green vegetables (spinach, kale, collard greens) Berries (blueberries, strawberries, raspberries) Garlic and onions Green tea Dark chocolate (70% cocoa or higher) Soy products (tofu, edamame)  Most unhealthy options:  Trans fats (partially hydrogenated oils) Processed meats (bacon, sausages, hot dogs) Fast food and fried foods Sugary drinks and sodas Refined carbohydrates (white bread, pastries, cookies) Full-fat dairy products Red meat (especially high-fat cuts) Coconut oil and palm oil Packaged snack foods (chips, crackers) Ice cream and other high-fat desserts Alcohol (especially in excess) Foods high in dietary cholesterol (egg yolks, organ meats)  The patient should focus on incorporating more of the healthy options into their diet while minimizing or eliminating the unhealthy ones. Additionally, regular exercise, maintaining a healthy weight, and avoiding  smoking can also help improve cholesterol levels.   Salmon (especially wild-caught) Mackerel Sardines Anchovies [place] trout [place] tuna Arctic char Rainbow trout Sablefish (black cod) Whitefish Bluefish Halibut Striped bass   If you're avoiding processed foods like crackers but still want to enjoy tuna, there are several healthy alternatives you can use. Here are some options:  Cucumber slices: Cut thick slices of cucumber to use as a crunchy base. Bell pepper slices: Use colorful bell pepper slices for a nutrient-rich, crunchy option. Lettuce wraps: Large lettuce leaves (like romaine or butter lettuce) make great wraps. Celery sticks: The natural groove in celery is perfect for holding tuna. Sliced tomatoes: Use thick slices of tomato as a juicy base. Avocado halves: Fill half an avocado with tuna for a nutrient-dense meal. Sweet potato toast: Slice sweet potato thinly and toast it for a nutrient-rich base. Zucchini rounds: Slice zucchini into thick rounds for a low-carb option. Carrot chips: Use thick-cut carrot slices or carrot chips. Whole grain toast: If you want a bread-like option, choose whole grain bread toasted. Cauliflower toast: Make a cauliflower-based flatbread as a low-carb alternative. Plantain chips: If you can find or make unprocessed plantain chips, they can be a good option.  These alternatives not only provide a healthy base for your tuna but also add extra nutrients and fiber to your meal. Would you like any specific recipes or preparation tips for these options?   Based on the information provided, let's discuss the potential risks and benefits of adding rosuvastatin for your situation. It's important to note that while I can provide general information, the final decision should be made in consultation with your healthcare provider.  Benefits of rosuvastatin:  1. LDL reduction: Rosuvastatin is highly effective at lowering LDL cholesterol. Your  LDL is 173 mg/dL, which is high. Statins like rosuvastatin can typically reduce  LDL by 30-50%.  2. Cardiovascular risk reduction: Statins have been shown to reduce the risk of heart attacks, strokes, and cardiovascular deaths, especially in those with risk factors.  3. Family history consideration: Given your grandfather's stroke and father's coronary artery disease, you may have a genetic predisposition to cardiovascular disease. Statins can be particularly beneficial in such cases.  4. Plaque stabilization: Statins can help stabilize existing arterial plaques, reducing the risk of rupture and subsequent cardiovascular events.  5. Anti-inflammatory effects: Statins have anti-inflammatory properties that may provide additional cardiovascular benefits beyond cholesterol lowering.  Risks and considerations:  1. Muscle-related side effects: The most common side effect is muscle pain or weakness, affecting about 5-10% of users. Rarely, this can progress to a serious condition called rhabdomyolysis.  2. Liver function: Statins can occasionally cause elevated liver enzymes. Your current liver function tests (ALT, AST, ALKPHOS) are normal, which is favorable.  3. Diabetes risk: There's a slightly increased risk of developing type 2 diabetes with statin use, particularly in those already at risk for diabetes.  4. Drug interactions: Rosuvastatin can interact with certain medications, so a thorough review of all your medications is necessary.  5. Cost and long-term commitment: Statin therapy is typically long-term, which means ongoing medication costs and regular follow-up appointments.  Additional considerations:  1. Your 10-year ASCVD risk score is 4.1%, which is considered low risk (<7.5%). However, this score doesn't account for family history.  2. Your HDL (31.1 mg/dL) is low, which is an independent risk factor. Lifestyle modifications to increase HDL should be a priority.  3. Your  triglycerides are high (183 mg/dL), which can also contribute to cardiovascular risk.  4. Your total cholesterol to HDL ratio is high at 7.7 (010/27.2), which is associated with increased cardiovascular risk.  Given your family history and lipid profile, discussing statin therapy with your healthcare provider is reasonable, even though your ASCVD risk score is low. They might consider starting with a lower dose or trying lifestyle modifications first.  Remember, this decision should be made in conjunction with your healthcare provider, considering your full medical history, preferences, and individual risk factors.  We did this entire discussion and he would like to try lifestyle changes for 6 months then recheck to see if that's working.

## 2023-02-20 NOTE — Assessment & Plan Note (Signed)
Inguinal Hernia: We discussed the need for [place] confirmation and had referral coordinator follow up on this order, and also plan for surgery to see soon to discuss surgical repair and emphasized the importance of avoiding heavy lifting and exercises that increase intra-abdominal pressure. He will be referred to a surgeon for hernia repair and should avoid heavy lifting and exercises that increase intra-abdominal pressure.    When dealing with an inguinal hernia, it's crucial to approach exercise cautiously to avoid exacerbating the condition. However, with proper guidance and technique, it's possible to engage in muscle building and modified high-intensity interval training (HIIT). Here are some safer options: Muscle Building:  Upper body exercises:  Seated chest press Seated shoulder press Lat pulldowns Seated rows Bicep curls Tricep extensions   Lower body exercises (with caution):  Leg extensions Leg curls Calf raises   Core exercises:  Planks (if tolerated) Bird dog Dead bug Seated ab crunches    Modified HIIT:  Upper body focus:  Arm circles Shoulder taps Modified burpees (without jumps) Boxing punches   Lower impact cardio:  Lawyer (if approved by doctor)   Core engagement:  [place] twists (without weight) Seated in-and-out abs    General guidelines:  Avoid exercises that increase intra-abdominal pressure, such as heavy lifting, traditional sit-ups, or full squats. Use lighter weights and focus on higher repetitions. Avoid exercises that cause pain or discomfort in the hernia area. Breathe steadily and avoid holding your breath during exercises. Consider wearing a hernia support belt during exercise (consult your doctor first). Start with shorter, less intense workouts and gradually increase as tolerated. Listen to your body and stop if you experience pain or discomfort.

## 2023-02-20 NOTE — Progress Notes (Signed)
[facility]: [phone]   Routine Medical Office Visit  Patient:  Mario Cain      Age: 47 y.o.       Sex:  male  Date:   02/20/2023 Patient Care Team: Lula Olszewski, MD as PCP - General (Internal Medicine) Today's Healthcare Provider: Lula Olszewski, MD   Assessment and Plan:   Mario Cain was seen today for follow-up to discuss labs and treatment plan.  Mixed hyperlipidemia Overview: Medications: not yet discussed Lab Results  Component Value Date   HDL 31.10 (L) 02/06/2023   CHOLHDL 8 02/06/2023   Lab Results  Component Value Date   LDLCALC 173 (H) 02/06/2023   Lab Results  Component Value Date   TRIG 183.0 (H) 02/06/2023   Lab Results  Component Value Date   CHOL 240 (H) 02/06/2023   The 10-year ASCVD risk score (Arnett DK, et al., 2019) is: 4.1%   Values used to calculate the score:     Age: 74 years     Sex: Male     Is Non-Hispanic African American: Yes     Diabetic: No     Tobacco smoker: No     Systolic Blood Pressure: 108 mmHg     Is BP treated: No     HDL Cholesterol: 31.1 mg/dL     Total Cholesterol: 240 mg/dL Lab Results  Component Value Date   ALT 27 02/06/2023   AST 17 02/06/2023   ALKPHOS 52 02/06/2023   TSH 2.28 02/06/2023   Body mass index is 25 kg/m.  Lipoprotein(a), Apolipoprotein B (ApoB), and High-sensitivity C-reactive protein (hs-CRP) No results found for: "HSCRP", "LIPOA" Improving Your Cholesterol: Diet: Focus on a Mediterranean-style diet, limit saturated fats and sugars, and increase omega-3 fatty acids (fish, flaxseeds,nuts,extra virgin olive oil, avocados). Exercise: Engage in regular physical activity (aerobic exercises are particularly beneficial for HDL).  Assessment & Plan: Hyperlipidemia: With a low HDL cholesterol level of 31.1 and a family history of cardiovascular disease, we discussed the importance of diet and lifestyle modifications, including incorporating extra virgin olive oil,  avocados, and fish oil supplements. We will continue these lifestyle modifications, focusing on diet and exercise, and increase fish oil intake to 4 grams per day. We will also consider starting low-dose rosuvastatin after rechecking cholesterol in 6 months, discussing the potential benefits and risks.  Recommended fish oil, try to get to 4 grams daily   Gave list of diet adjustments: I'll provide a list of healthy and unhealthy food options to help address the cholesterol levels you've mentioned. The goal is to raise HDL (good cholesterol) and lower LDL (bad cholesterol). Healthiest options:  Fatty fish (salmon, mackerel, sardines, trout) Nuts (almonds, walnuts, pecans) Seeds (chia, flax, pumpkin) Avocados Olive oil and olives Whole grains (oats, quinoa, brown rice) Legumes (beans, lentils, chickpeas) Leafy green vegetables (spinach, kale, collard greens) Berries (blueberries, strawberries, raspberries) Garlic and onions Green tea Dark chocolate (70% cocoa or higher) Soy products (tofu, edamame)  Most unhealthy options:  Trans fats (partially hydrogenated oils) Processed meats (bacon, sausages, hot dogs) Fast food and fried foods Sugary drinks and sodas Refined carbohydrates (white bread, pastries, cookies) Full-fat dairy products Red meat (especially high-fat cuts) Coconut oil and palm oil Packaged snack foods (chips, crackers) Ice cream and other high-fat desserts Alcohol (especially in excess) Foods high in dietary cholesterol (egg yolks, organ meats)  The patient should focus on incorporating more of the healthy options into their diet while minimizing or eliminating the unhealthy ones.  Additionally, regular exercise, maintaining a healthy weight, and avoiding smoking can also help improve cholesterol levels.   Salmon (especially wild-caught) Mackerel Sardines Anchovies [place] trout Alb[place]ore tuna Arctic char Rainbow trout Sablefish (black  cod) Whitefish Bluefish Halibut Striped bass   If you're avoiding processed foods like crackers but still want to enjoy tuna, there are several healthy alternatives you can use. Here are some options:  Cucumber slices: Cut thick slices of cucumber to use as a crunchy base. Bell pepper slices: Use colorful bell pepper slices for a nutrient-rich, crunchy option. Lettuce wraps: Large lettuce leaves (like romaine or butter lettuce) make great wraps. Celery sticks: The natural groove in celery is perfect for holding tuna. Sliced tomatoes: Use thick slices of tomato as a juicy base. Avocado halves: Fill half an avocado with tuna for a nutrient-dense meal. Sweet potato toast: Slice sweet potato thinly and toast it for a nutrient-rich base. Zucchini rounds: Slice zucchini into thick rounds for a low-carb option. Carrot chips: Use thick-cut carrot slices or carrot chips. Whole grain toast: If you want a bread-like option, choose whole grain bread toasted. Cauliflower toast: Make a cauliflower-based flatbread as a low-carb alternative. Plantain chips: If you can find or make unprocessed plantain chips, they can be a good option.  These alternatives not only provide a healthy base for your tuna but also add extra nutrients and fiber to your meal. Would you like any specific recipes or preparation tips for these options?   Based on the information provided, let's discuss the potential risks and benefits of adding rosuvastatin for your situation. It's important to note that while I can provide general information, the final decision should be made in consultation with your healthcare provider.  Benefits of rosuvastatin:  1. LDL reduction: Rosuvastatin is highly effective at lowering LDL cholesterol. Your LDL is 173 mg/dL, which is high. Statins like rosuvastatin can typically reduce LDL by 30-50%.  2. Cardiovascular risk reduction: Statins have been shown to reduce the risk of heart attacks,  strokes, and cardiovascular deaths, especially in those with risk factors.  3. Family history consideration: Given your grandfather's stroke and father's coronary artery disease, you may have a genetic predisposition to cardiovascular disease. Statins can be particularly beneficial in such cases.  4. Plaque stabilization: Statins can help stabilize existing arterial plaques, reducing the risk of rupture and subsequent cardiovascular events.  5. Anti-inflammatory effects: Statins have anti-inflammatory properties that may provide additional cardiovascular benefits beyond cholesterol lowering.  Risks and considerations:  1. Muscle-related side effects: The most common side effect is muscle pain or weakness, affecting about 5-10% of users. Rarely, this can progress to a serious condition called rhabdomyolysis.  2. Liver function: Statins can occasionally cause elevated liver enzymes. Your current liver function tests (ALT, AST, ALKPHOS) are normal, which is favorable.  3. Diabetes risk: There's a slightly increased risk of developing type 2 diabetes with statin use, particularly in those already at risk for diabetes.  4. Drug interactions: Rosuvastatin can interact with certain medications, so a thorough review of all your medications is necessary.  5. Cost and long-term commitment: Statin therapy is typically long-term, which means ongoing medication costs and regular follow-up appointments.  Additional considerations:  1. Your 10-year ASCVD risk score is 4.1%, which is considered low risk (<7.5%). However, this score doesn't account for family history.  2. Your HDL (31.1 mg/dL) is low, which is an independent risk factor. Lifestyle modifications to increase HDL should be a priority.  3. Your triglycerides  are high (183 mg/dL), which can also contribute to cardiovascular risk.  4. Your total cholesterol to HDL ratio is high at 7.7 (546/56.8), which is associated with increased  cardiovascular risk.  Given your family history and lipid profile, discussing statin therapy with your healthcare provider is reasonable, even though your ASCVD risk score is low. They might consider starting with a lower dose or trying lifestyle modifications first.  Remember, this decision should be made in conjunction with your healthcare provider, considering your full medical history, preferences, and individual risk factors.  We did this entire discussion and he would like to try lifestyle changes for 6 months then recheck to see if that's working.   Incomplete emptying of bladder Overview: Taking supplement called urinozinc that seems to help Associated with possible hernia   Assessment & Plan: Urinary Retention: He has shown improvement with the use of Urozine Prostate Plus, likely due to the saw palmetto component. He will continue Urozine Prostate Plus as tolerated.   Abdominal swelling Assessment & Plan: Inguinal Hernia: We discussed the need for [place] confirmation and had referral coordinator follow up on this order, and also plan for surgery to see soon to discuss surgical repair and emphasized the importance of avoiding heavy lifting and exercises that increase intra-abdominal pressure. He will be referred to a surgeon for hernia repair and should avoid heavy lifting and exercises that increase intra-abdominal pressure.    When dealing with an inguinal hernia, it's crucial to approach exercise cautiously to avoid exacerbating the condition. However, with proper guidance and technique, it's possible to engage in muscle building and modified high-intensity interval training (HIIT). Here are some safer options: Muscle Building:  Upper body exercises:  Seated chest press Seated shoulder press Lat pulldowns Seated rows Bicep curls Tricep extensions   Lower body exercises (with caution):  Leg extensions Leg curls Calf raises   Core exercises:  Planks (if  tolerated) Bird dog Dead bug Seated ab crunches    Modified HIIT:  Upper body focus:  Arm circles Shoulder taps Modified burpees (without jumps) Boxing punches   Lower impact cardio:  Lawyer (if approved by doctor)   Core engagement:  [place] twists (without weight) Seated in-and-out abs    General guidelines:  Avoid exercises that increase intra-abdominal pressure, such as heavy lifting, traditional sit-ups, or full squats. Use lighter weights and focus on higher repetitions. Avoid exercises that cause pain or discomfort in the hernia area. Breathe steadily and avoid holding your breath during exercises. Consider wearing a hernia support belt during exercise (consult your doctor first). Start with shorter, less intense workouts and gradually increase as tolerated. Listen to your body and stop if you experience pain or discomfort.     General Health Maintenance: We will consider starting a vitamin D supplement due to potential deficiency. A 1-year routine follow-up and a 51-month cholesterol lab check are scheduled.        Recommended follow up: Return in about 6 months (around 08/23/2023).  Future Appointments  Date Time Provider Department Center  02/27/2023 11:30 AM [id number] [place] 2 [id number] [address] [facility]          Clinical Presentation:    47 y.o. male who has Hypogonadism in male; Incomplete emptying of bladder; Fatigue; Abdominal swelling; Internal hemorrhoids; and Hyperlipidemia on their problem list. His reasons/main concerns/chief complaints for today's office visit are Follow-up to discuss labs and treatment plan   AI-Extracted: Discussed the use of AI scribe software for clinical  note transcription with the patient, who gave verbal consent to proceed.  History of Present Illness   The patient, known to have hyperlipidemia, presented for a follow-up consultation to discuss recent lab results. He reported being on  MyChart and taking fish oil supplements daily. Despite these efforts, his HDL cholesterol was found to be significantly low at 31.1, which he attributed to genetic predisposition.  The patient expressed a willingness to make dietary changes to improve his cholesterol profile. He reported using extra virgin olive oil in cooking and consuming cheese and donuts frequently, but admitted to a low intake of fish and avocados. He was open to the idea of incorporating more fish into his diet, particularly albacore tuna, and reducing his intake of processed foods and sweets.  In addition to hyperlipidemia, the patient also reported a history of urinary retention. He has been self-managing this condition with a supplement called Urozine Prostate Plus, which contains saw palmetto and zinc. He reported some improvement in his symptoms since starting this supplement.  The patient also mentioned a recent change in his exercise habits due to gaining custody of his 62 year old daughter. Prior to this, he was regularly engaged in high-intensity interval training (HIIT) and other aerobic exercises. He expressed a desire to return to regular exercise once his daughter's schedule stabilizes.  The patient also reported a family history of cardiovascular disease and diabetes, which he is keen to avoid. He has been proactive in his health management, researching and implementing dietary changes and supplements to manage his conditions. He expressed a willingness to consider medication for his hyperlipidemia, but preferred to attempt lifestyle modifications first.  In summary, the patient presented with concerns about his low HDL cholesterol levels and ongoing urinary retention. He is actively engaged in managing his health and is open to both lifestyle modifications and potential medication to improve his cholesterol profile. He also reported some improvement in urinary symptoms with the use of a dietary supplement.     He   has a past medical history of Allergy and High cholesterol. Current Outpatient Medications on File Prior to Visit  Medication Sig   CLOMID 50 MG tablet Take by mouth.   COD LIVER OIL PO Take 1,000 mg by mouth daily.   Multiple Vitamin (MULTIVITAMIN PO) Take 2 each by mouth daily. Gummies.   No current facility-administered medications on file prior to visit.   Medications Discontinued During This Encounter  Medication Reason   clomiPHENE Citrate (CLOMID PO)          Clinical Data Analysis:   Physical Exam  BP 108/74 (BP Location: Right Arm, Patient Position: Sitting)   Pulse 85   Temp 98.1 F (36.7 C) (Temporal)   Ht 5\' 7"  (1.702 m)   Wt 159 lb 9.6 oz (72.4 kg)   SpO2 97%   BMI 25.00 kg/m  Wt Readings from Last 10 Encounters:  02/20/23 159 lb 9.6 oz (72.4 kg)  02/05/23 161 lb (73 kg)  02/13/22 164 lb 14.5 oz (74.8 kg)  04/23/21 165 lb (74.8 kg)  02/09/21 165 lb (74.8 kg)  07/10/20 158 lb (71.7 kg)   Vital signs reviewed.  Nursing notes reviewed. Weight trend reviewed. Abnormalities and Problem-Specific physical exam findings:    General Appearance:  No acute distress appreciable.   Well-groomed, healthy-appearing male.  Well proportioned with no abnormal fat distribution.  Good muscle tone. Skin: Clear and well-hydrated. Pulmonary:  Normal work of breathing at rest, no respiratory distress apparent. SpO2: 97 %  Musculoskeletal: All extremities are intact.  Neurological:  Awake, alert, oriented, and engaged.  No obvious focal neurological deficits or cognitive impairments.  Sensorium seems unclouded.   Speech is clear and coherent with logical content. Psychiatric:  Appropriate mood, pleasant and cooperative demeanor, thoughtful and engaged during the exam  Results Reviewed:      No results found for any visits on 02/20/23.  Scanned Document on 02/08/2023  Component Date Value   HM Colonoscopy 11/14/2021 See Report (in chart)   Lab on 02/06/2023  Component Date  Value   WBC 02/06/2023 4.6    RBC 02/06/2023 5.36    Platelets 02/06/2023 264.0    Hemoglobin 02/06/2023 16.1    HCT 02/06/2023 47.4    MCV 02/06/2023 88.4    MCHC 02/06/2023 34.0    RDW 02/06/2023 13.3    Sodium 02/06/2023 136    Potassium 02/06/2023 3.8    Chloride 02/06/2023 102    CO2 02/06/2023 26    Glucose, Bld 02/06/2023 113 (H)    BUN 02/06/2023 14    Creatinine, Ser 02/06/2023 1.06    Total Bilirubin 02/06/2023 0.6    Alkaline Phosphatase 02/06/2023 52    AST 02/06/2023 17    ALT 02/06/2023 27    Total Protein 02/06/2023 7.4    Albumin 02/06/2023 4.5    GFR 02/06/2023 83.51    Calcium 02/06/2023 9.7    TSH 02/06/2023 2.28    Cholesterol 02/06/2023 240 (H)    Triglycerides 02/06/2023 183.0 (H)    HDL 02/06/2023 31.10 (L)    VLDL 02/06/2023 36.6    LDL Cholesterol 02/06/2023 173 (H)    Total CHOL/HDL Ratio 02/06/2023 8    NonHDL 02/06/2023 209.28   Office Visit on 02/05/2023  Component Date Value   Color, Urine 02/05/2023 YELLOW    APPearance 02/05/2023 CLEAR    Specific Gravity, Urine 02/05/2023 <=1.005 (A)    pH 02/05/2023 7.0    Total Protein, Urine 02/05/2023 NEGATIVE    Urine Glucose 02/05/2023 NEGATIVE    Ketones, ur 02/05/2023 NEGATIVE    Bilirubin Urine 02/05/2023 NEGATIVE    Hgb urine dipstick 02/05/2023 NEGATIVE    Urobilinogen, UA 02/05/2023 0.2    Leukocytes,Ua 02/05/2023 NEGATIVE    Nitrite 02/05/2023 NEGATIVE    WBC, UA 02/05/2023 0-2/hpf    RBC / HPF 02/05/2023 none seen   Admission on 04/29/2022, Discharged on 04/29/2022  Component Date Value   Sodium 04/29/2022 137    Potassium 04/29/2022 4.1    Chloride 04/29/2022 100    CO2 04/29/2022 29    Glucose, Bld 04/29/2022 86    BUN 04/29/2022 18    Creatinine, Ser 04/29/2022 1.38 (H)    Calcium 04/29/2022 10.0    GFR, Estimated 04/29/2022 >60    Anion gap 04/29/2022 8    WBC 04/29/2022 6.5    RBC 04/29/2022 5.48    Hemoglobin 04/29/2022 17.2 (H)    HCT 04/29/2022 48.3    MCV  04/29/2022 88.1    MCH 04/29/2022 31.4    MCHC 04/29/2022 35.6    RDW 04/29/2022 12.8    Platelets 04/29/2022 292    nRBC 04/29/2022 0.0    Neutrophils Relative % 04/29/2022 40    Neutro Abs 04/29/2022 2.6    Lymphocytes Relative 04/29/2022 46    Lymphs Abs 04/29/2022 3.0    Monocytes Relative 04/29/2022 8    Monocytes Absolute 04/29/2022 0.5    Eosinophils Relative 04/29/2022 5    Eosinophils Absolute 04/29/2022 0.3    Basophils  Relative 04/29/2022 1    Basophils Absolute 04/29/2022 0.1    Immature Granulocytes 04/29/2022 0    Abs Immature Granulocytes 04/29/2022 0.02    Color, Urine 04/29/2022 YELLOW    APPearance 04/29/2022 CLEAR    Specific Gravity, Urine 04/29/2022 1.023    pH 04/29/2022 6.5    Glucose, UA 04/29/2022 NEGATIVE    Hgb urine dipstick 04/29/2022 NEGATIVE    Bilirubin Urine 04/29/2022 NEGATIVE    Ketones, ur 04/29/2022 15 (A)    Protein, ur 04/29/2022 NEGATIVE    Nitrite 04/29/2022 NEGATIVE    Leukocytes,Ua 04/29/2022 SMALL (A)    RBC / HPF 04/29/2022 0-5    WBC, UA 04/29/2022 6-10    Bacteria, UA 04/29/2022 RARE (A)    Mucus 04/29/2022 PRESENT    No image results found.   No results found.  DG Chest Port 1 View  Result Date: 04/29/2022 CLINICAL DATA:  Pain in the mid back, worse with breathing. EXAM: PORTABLE CHEST 1 VIEW COMPARISON:  07/02/2021 FINDINGS: The heart size and mediastinal contours are within normal limits. Both lungs are clear. The visualized skeletal structures are unremarkable. IMPRESSION: No active disease. Electronically Signed   By: Burman Nieves M.D.   On: 04/29/2022 19:05     Attestation:  I have personally spent 39 minutes involved in face-to-face and non-face-to-face activities for this patient on the day of the visit. Professional time spent includes the following activities:  Preparing to see the patient by reviewing medical records prior to and during the encounter; Obtaining, documenting, and reviewing an updated medical  history; Performing a medically appropriate examination;  Evaluating, synthesizing, and documenting the available clinical information in the EMR;  Communicating, counseling, educating about results to the patient/; Collaboratively developing and communicating an individualized treatment plan with the patient;   This time was independent of any separately billable procedure(s).  The extended duration of this patient visit was medically necessary due to several factors:  The patient's needs to understand extensive lifestyle changes he needs to make due to bad cholesterol profile on clomid with suspected inguinal hernia, and this required effective patient education and communication which was done verbally and visually with computer support, and data analysis and education material was provided to his MyChart.    All these factors are integral to providing high-quality patient care and ensuring optimal health outcomes.     This encounter employed real-time, collaborative documentation. The patient actively reviewed and updated their medical record on a shared screen, ensuring transparency and facilitating joint problem-solving for the problem list, overview, and plan. This approach promotes accurate, informed care. The treatment plan was discussed and reviewed in detail, including medication safety, potential side effects, and all patient questions. We confirmed understanding and comfort with the plan. Follow-up instructions were established, including contacting the office for any concerns, returning if symptoms worsen, persist, or new symptoms develop, and precautions for potential emergency department visits. ----------------------------------------------------- Lula Olszewski, MD  02/20/2023 4:03 PM  [facility]:  [phone]

## 2023-02-20 NOTE — Patient Instructions (Addendum)
It was a pleasure seeing you today! Your health and satisfaction are our top priorities.  Glenetta Hew, MD  Your Providers PCP: Lula Olszewski, MD,  [phone]) Referring Provider: Lula Olszewski, MD,  [phone])  VISIT SUMMARY:  During our appointment, we discussed your concerns about your low HDL cholesterol levels and ongoing urinary retention. We also talked about your family history of cardiovascular disease and diabetes, and your desire to avoid these conditions. You are actively engaged in managing your health and are open to both lifestyle modifications and potential medication to improve your cholesterol profile. You also reported some improvement in urinary symptoms with the use of a dietary supplement.  YOUR PLAN:  -HYPERLIPIDEMIA: Hyperlipidemia is a condition where there are high levels of fats (lipids) in the blood. We discussed the importance of diet and lifestyle modifications, including incorporating extra virgin olive oil, avocados, and fish oil supplements. We will continue these lifestyle modifications, focusing on diet and exercise, and increase fish oil intake to 4 grams per day. We will also consider starting low-dose rosuvastatin, a medication to lower cholesterol, after rechecking cholesterol in 6 months.  -URINARY RETENTION: Urinary retention is a condition where you cannot empty your bladder completely. You have shown improvement with the use of Urozine Prostate Plus, likely due to the saw palmetto component. You will continue Urozine Prostate Plus as tolerated.  -INGUINAL HERNIA: An inguinal hernia is a condition where tissue, like part of the intestine, protrudes through a weak spot in the abdominal muscles. We discussed the need for surgical repair and emphasized the importance of avoiding heavy lifting and exercises that increase intra-abdominal pressure. You will be referred to a surgeon for hernia repair.  -GENERAL HEALTH MAINTENANCE: We will consider  starting a vitamin D supplement due to potential deficiency. A 1-year routine follow-up and a 60-month cholesterol lab check are scheduled.  INSTRUCTIONS:  Please continue with your current diet and lifestyle modifications, and increase your fish oil intake to 4 grams per day. Continue taking Urozine Prostate Plus as tolerated. Avoid heavy lifting and exercises that increase intra-abdominal pressure until after your hernia repair. We will recheck your cholesterol in 6 months and consider starting low-dose rosuvastatin at that time. A 1-year routine follow-up and a 65-month cholesterol lab check are scheduled.   NEXT STEPS: [x]  Early Intervention: Schedule sooner appointment, call our on-call services, or go to emergency room if there is any significant Increase in pain or discomfort New or worsening symptoms Sudden or severe changes in your health [x]  Flexible Follow-Up: We recommend a Return in about 6 months (around 08/23/2023). for optimal routine care. This allows for progress monitoring and treatment adjustments. [x]  Preventive Care: Schedule your annual preventive care visit! It's typically covered by insurance and helps identify potential health issues early. [x]  Lab & X-ray Appointments: Incomplete tests scheduled today, or call to schedule. X-rays: [facility] (M-F, 8:30am-noon or 1pm-5pm). [x]  Medical Information Release: Sign a release form at front desk to obtain relevant medical information we don't have.  MAKING THE MOST OF OUR FOCUSED 20 MINUTE APPOINTMENTS: [x]   Clearly state your top concerns at the beginning of the visit to focus our discussion [x]   If you anticipate you will need more time, please inform the front desk during scheduling - we can book multiple appointments in the same week. [x]   If you have transportation problems- use our convenient video appointments or ask about transportation support. [x]   We can get down to business faster  if you use MyChart  to update information before the visit and submit non-urgent questions before your visit. Thank you for taking the time to provide details through MyChart.  Let our nurse know and she can import this information into your encounter documents.  Arrival and Wait Times: [x]   Arriving on time ensures that everyone receives prompt attention. [x]   Early morning (8a) and afternoon (1p) appointments tend to have shortest wait times. [x]   Unfortunately, we cannot delay appointments for late arrivals or hold slots during phone calls.  Getting Answers and Following Up [x]   Simple Questions & Concerns: For quick questions or basic follow-up after your visit, reach [place] at [phone] or MyChart messaging. [x]   Complex Concerns: If your concern is more complex, scheduling an appointment might be best. Discuss this with the staff to find the most suitable option. [x]   Lab & Imaging Results: We'll contact you directly if results are abnormal or you don't use MyChart. Most normal results will be on MyChart within 2-3 business days, with a review message from Dr. Jon Billings. Haven't heard back in 2 weeks? Need results sooner? Contact us at [phone]. [x]   Referrals: Our referral coordinator will manage specialist referrals. The specialist's office should contact you within 2 weeks to schedule an appointment. Call us if you haven't heard from them after 2 weeks.  Staying Connected [x]   MyChart: Activate your MyChart for the fastest way to access results and message [place]. See the last page of this paperwork for instructions on how to activate.  Bring to Your Next Appointment [x]   Medications: Please bring all your medication bottles to your next appointment to ensure we have an accurate record of your prescriptions. [x]   Health Diaries: If you're monitoring any health conditions at home, keeping a diary of your readings can be very helpful for discussions at your next appointment.  Billing [x]   X-ray & Lab  Orders: These are billed by separate companies. Contact the invoicing company directly for questions or concerns. [x]   Visit Charges: Discuss any billing inquiries with our administrative services team.  Your Satisfaction Matters [x]   Share Your Experience: We strive for your satisfaction! If you have any complaints, or preferably compliments, please let Dr. Jon Billings know directly or contact our Practice Administrators, Edwena Felty or [employer], by asking at the front desk.   Reviewing Your Records [x]   Review this early draft of your clinical encounter notes below and the final encounter summary tomorrow on MyChart after its been completed.  All orders placed so far are visible here: Mixed hyperlipidemia Assessment & Plan: Recommended fish oil, try to get to 4 grams daily   Gave list of diet adjustments: I'll provide a list of healthy and unhealthy food options to help address the cholesterol levels you've mentioned. The goal is to raise HDL (good cholesterol) and lower LDL (bad cholesterol). Healthiest options:  Fatty fish (salmon, mackerel, sardines, trout) Nuts (almonds, walnuts, pecans) Seeds (chia, flax, pumpkin) Avocados Olive oil and olives Whole grains (oats, quinoa, brown rice) Legumes (beans, lentils, chickpeas) Leafy green vegetables (spinach, kale, collard greens) Berries (blueberries, strawberries, raspberries) Garlic and onions Green tea Dark chocolate (70% cocoa or higher) Soy products (tofu, edamame)  Most unhealthy options:  Trans fats (partially hydrogenated oils) Processed meats (bacon, sausages, hot dogs) Fast food and fried foods Sugary drinks and sodas Refined carbohydrates (white bread, pastries, cookies) Full-fat dairy products Red meat (especially high-fat cuts) Coconut oil and palm oil Packaged snack foods (chips, crackers)  Ice cream and other high-fat desserts Alcohol (especially in excess) Foods high in dietary cholesterol (egg yolks,  organ meats)  The patient should focus on incorporating more of the healthy options into their diet while minimizing or eliminating the unhealthy ones. Additionally, regular exercise, maintaining a healthy weight, and avoiding smoking can also help improve cholesterol levels.   Salmon (especially wild-caught) Mackerel Sardines Anchovies [place] trout [place] tuna Arctic char Rainbow trout Sablefish (black cod) Whitefish Bluefish Halibut Striped bass   If you're avoiding processed foods like crackers but still want to enjoy tuna, there are several healthy alternatives you can use. Here are some options:  Cucumber slices: Cut thick slices of cucumber to use as a crunchy base. Bell pepper slices: Use colorful bell pepper slices for a nutrient-rich, crunchy option. Lettuce wraps: Large lettuce leaves (like romaine or butter lettuce) make great wraps. Celery sticks: The natural groove in celery is perfect for holding tuna. Sliced tomatoes: Use thick slices of tomato as a juicy base. Avocado halves: Fill half an avocado with tuna for a nutrient-dense meal. Sweet potato toast: Slice sweet potato thinly and toast it for a nutrient-rich base. Zucchini rounds: Slice zucchini into thick rounds for a low-carb option. Carrot chips: Use thick-cut carrot slices or carrot chips. Whole grain toast: If you want a bread-like option, choose whole grain bread toasted. Cauliflower toast: Make a cauliflower-based flatbread as a low-carb alternative. Plantain chips: If you can find or make unprocessed plantain chips, they can be a good option.  These alternatives not only provide a healthy base for your tuna but also add extra nutrients and fiber to your meal. Would you like any specific recipes or preparation tips for these options?   Based on the information provided, let's discuss the potential risks and benefits of adding rosuvastatin for your situation. It's important to note that while I  can provide general information, the final decision should be made in consultation with your healthcare provider.  Benefits of rosuvastatin:  1. LDL reduction: Rosuvastatin is highly effective at lowering LDL cholesterol. Your LDL is 173 mg/dL, which is high. Statins like rosuvastatin can typically reduce LDL by 30-50%.  2. Cardiovascular risk reduction: Statins have been shown to reduce the risk of heart attacks, strokes, and cardiovascular deaths, especially in those with risk factors.  3. Family history consideration: Given your grandfather's stroke and father's coronary artery disease, you may have a genetic predisposition to cardiovascular disease. Statins can be particularly beneficial in such cases.  4. Plaque stabilization: Statins can help stabilize existing arterial plaques, reducing the risk of rupture and subsequent cardiovascular events.  5. Anti-inflammatory effects: Statins have anti-inflammatory properties that may provide additional cardiovascular benefits beyond cholesterol lowering.  Risks and considerations:  1. Muscle-related side effects: The most common side effect is muscle pain or weakness, affecting about 5-10% of users. Rarely, this can progress to a serious condition called rhabdomyolysis.  2. Liver function: Statins can occasionally cause elevated liver enzymes. Your current liver function tests (ALT, AST, ALKPHOS) are normal, which is favorable.  3. Diabetes risk: There's a slightly increased risk of developing type 2 diabetes with statin use, particularly in those already at risk for diabetes.  4. Drug interactions: Rosuvastatin can interact with certain medications, so a thorough review of all your medications is necessary.  5. Cost and long-term commitment: Statin therapy is typically long-term, which means ongoing medication costs and regular follow-up appointments.  Additional considerations:  1. Your 10-year ASCVD risk score is 4.1%,  which is considered  low risk (<7.5%). However, this score doesn't account for family history.  2. Your HDL (31.1 mg/dL) is low, which is an independent risk factor. Lifestyle modifications to increase HDL should be a priority.  3. Your triglycerides are high (183 mg/dL), which can also contribute to cardiovascular risk.  4. Your total cholesterol to HDL ratio is high at 7.7 (657/84.6), which is associated with increased cardiovascular risk.  Given your family history and lipid profile, discussing statin therapy with your healthcare provider is reasonable, even though your ASCVD risk score is low. They might consider starting with a lower dose or trying lifestyle modifications first.  Remember, this decision should be made in conjunction with your healthcare provider, considering your full medical history, preferences, and individual risk factors.  We did this entire discussion and he would like to try lifestyle changes for 6 months then recheck to see if that's working.

## 2023-02-20 NOTE — Assessment & Plan Note (Signed)
Urinary Retention: He has shown improvement with the use of Urozine Prostate Plus, likely due to the saw palmetto component. He will continue Urozine Prostate Plus as tolerated.

## 2023-02-27 ENCOUNTER — Ambulatory Visit
Admission: RE | Admit: 2023-02-27 | Discharge: 2023-02-27 | Disposition: A | Discharge status: Home / Self Care | Payer: BC Managed Care – PPO | Source: Ambulatory Visit | Attending: Internal Medicine | Admitting: Internal Medicine

## 2023-02-27 ENCOUNTER — Other Ambulatory Visit: Payer: Self-pay | Admitting: Internal Medicine

## 2023-02-27 DIAGNOSIS — R1900 Intra-abdominal and pelvic swelling, mass and lump, unspecified site: Secondary | ICD-10-CM

## 2023-02-27 DIAGNOSIS — E663 Overweight: Secondary | ICD-10-CM

## 2023-02-27 DIAGNOSIS — R5383 Other fatigue: Secondary | ICD-10-CM

## 2023-02-27 DIAGNOSIS — R339 Retention of urine, unspecified: Secondary | ICD-10-CM

## 2023-02-27 DIAGNOSIS — E291 Testicular hypofunction: Secondary | ICD-10-CM

## 2023-02-27 DIAGNOSIS — K4090 Unilateral inguinal hernia, without obstruction or gangrene, not specified as recurrent: Secondary | ICD-10-CM | POA: Diagnosis not present

## 2023-03-05 NOTE — Progress Notes (Signed)
The ultrasound confirmed Inguinal hernia - at our visit we discussed the need  plan for surgery to see soon to discuss surgical repair and emphasized the importance of avoiding heavy lifting and exercises that increase intra-abdominal pressure. He will be referred to a surgeon for hernia repair and should avoid heavy lifting and exercises that increase intra-abdominal pressure. Do you want fresh referral, and do you want it to be [facility] [employer] from 10/2021

## 2023-03-13 ENCOUNTER — Telehealth (HOSPITAL_COMMUNITY): Payer: Self-pay

## 2023-03-13 ENCOUNTER — Ambulatory Visit (HOSPITAL_COMMUNITY)
Admission: EM | Admit: 2023-03-13 | Discharge: 2023-03-13 | Disposition: A | Discharge status: Home / Self Care | Payer: BC Managed Care – PPO | Attending: Nurse Practitioner | Admitting: Nurse Practitioner

## 2023-03-13 ENCOUNTER — Other Ambulatory Visit (HOSPITAL_BASED_OUTPATIENT_CLINIC_OR_DEPARTMENT_OTHER): Payer: Self-pay

## 2023-03-13 ENCOUNTER — Encounter (HOSPITAL_COMMUNITY): Payer: Self-pay

## 2023-03-13 DIAGNOSIS — S46811A Strain of other muscles, fascia and tendons at shoulder and upper arm level, right arm, initial encounter: Secondary | ICD-10-CM

## 2023-03-13 MED ORDER — TIZANIDINE HCL 4 MG PO TABS: 4.0000 mg | ORAL_TABLET | Freq: Three times a day (TID) | ORAL | 0 refills | Status: DC | PRN

## 2023-03-13 MED ORDER — IBUPROFEN 800 MG PO TABS: 800.0000 mg | ORAL_TABLET | Freq: Three times a day (TID) | ORAL | 0 refills | Status: DC | PRN

## 2023-03-13 MED ORDER — TIZANIDINE HCL 4 MG PO TABS
4.0000 mg | ORAL_TABLET | Freq: Three times a day (TID) | ORAL | 0 refills | Status: DC | PRN
  Filled 2023-03-13: qty 30, 10d supply, fill #0

## 2023-03-13 MED ORDER — IBUPROFEN 800 MG PO TABS
800.0000 mg | ORAL_TABLET | Freq: Three times a day (TID) | ORAL | 0 refills | Status: DC | PRN
  Filled 2023-03-13: qty 30, 10d supply, fill #0

## 2023-03-13 NOTE — Discharge Instructions (Signed)
As we discussed, I suspect you have a strain of your trapezius muscle.  Please take ibuprofen 800 mg every 8 hours as needed for pain.  You can also take the tizanidine every 8 hours as needed for muscular spasms.  Continue heat/ice, light range of motion/retching exercises.  Have attached exercises to this handout.  Seek care if symptoms persist or worsen despite treatment.

## 2023-03-13 NOTE — ED Provider Notes (Signed)
[facility]    CSN: [id number] Arrival date & time: 03/13/23  1601      History   Chief Complaint Chief Complaint  Patient presents with   Shoulder Pain    HPI Mario Cain is a 47 y.o. male.   Patient presents today for 3-week history of right shoulder, neck, and trapezius pain.  Denies recent fall, injury, or trauma to the right shoulder or neck.  Reports symptoms began 1 morning and he thought he may have slept wrong but they have not improved.  He denies numbness or tingling shooting down to the fingertips.  Reports pain is worse with any movement.  No swelling or discoloration to the extremity or neck.  Has taken ibuprofen once without improvement.  No history of surgeries in the area.    Past Medical History:  Diagnosis Date   Allergy    High cholesterol     Patient Active Problem List   Diagnosis Date Noted   Hyperlipidemia 02/20/2023   Internal hemorrhoids 02/08/2023   Hypogonadism in male 02/05/2023   Incomplete emptying of bladder 02/05/2023   Fatigue 02/05/2023   Abdominal swelling 02/05/2023    Past Surgical History:  Procedure Laterality Date   arthritis         Home Medications    Prior to Admission medications   Medication Sig Start Date End Date Taking? Authorizing Provider  ibuprofen (ADVIL) 800 MG tablet Take 1 tablet (800 mg total) by mouth every 8 (eight) hours as needed. Take with food to prevent GI upset 03/13/23  Yes Cathlean Marseilles A, NP  tiZANidine (ZANAFLEX) 4 MG tablet Take 1 tablet (4 mg total) by mouth every 8 (eight) hours as needed for muscle spasms. Do not take with alcohol or while driving or operating heavy machinery.  May cause drowsiness. 03/13/23  Yes Valentino Nose, NP  CLOMID 50 MG tablet Take by mouth. 02/12/23   [provider]  COD LIVER OIL PO Take 1,000 mg by mouth daily.    [provider]  Multiple Vitamin (MULTIVITAMIN PO) Take 2 each by mouth daily. Gummies.    [provider]    Family History Family History  Problem Relation Age of Onset   Cancer Mother    Hyperlipidemia Mother    Diabetes Maternal Grandmother    Hypertension Maternal Grandfather    Hyperlipidemia Maternal Grandfather    Diabetes Maternal Grandfather     Social History Social History   Tobacco Use   Smoking status: Former    Types: Cigars   Smokeless tobacco: Never  Vaping Use   Vaping status: Never Used  Substance Use Topics   Alcohol use: Not Currently    Comment: socially   Drug use: No     Allergies   Egg-derived products and Other   Review of Systems Review of Systems Per HPI  Physical Exam Triage Vital Signs ED Triage Vitals [03/13/23 1637]  Encounter Vitals Group     BP 135/85     Systolic BP Percentile      Diastolic BP Percentile      Pulse Rate 72     Resp 18     Temp 98.2 F (36.8 C)     Temp Source Oral     SpO2 95 %     Weight      Height      Head Circumference      Peak Flow      Pain Score 10  Pain Loc      Pain Education      Exclude from Growth Chart    No data found.  Updated Vital Signs BP 135/85 (BP Location: Right Arm)   Pulse 72   Temp 98.2 F (36.8 C) (Oral)   Resp 18   SpO2 95%   Visual Acuity Right Eye Distance:   Left Eye Distance:   Bilateral Distance:    Right Eye Near:   Left Eye Near:    Bilateral Near:     Physical Exam Vitals and nursing note reviewed.  Constitutional:      General: He is not in acute distress.    Appearance: Normal appearance. He is not toxic-appearing.  Pulmonary:     Effort: Pulmonary effort is normal. No respiratory distress.  Musculoskeletal:     Comments: Inspection: no swelling, bruising, obvious deformity or redness to right trapezius, neck, shoulder Palpation: tender to palpation diffusely to right trapezius; no obvious deformities palpated ROM: Full ROM to bilateral upper extremities, neck Strength: 5/5 bilateral upper extremities Neurovascular: neurovascularly  intact in bilateral upper extremities  Skin:    General: Skin is warm and dry.     Capillary Refill: Capillary refill takes less than 2 seconds.     Coloration: Skin is not jaundiced or pale.     Findings: No erythema.  Neurological:     Mental Status: He is alert and oriented to person, place, and time.  Psychiatric:        Behavior: Behavior is cooperative.      UC Treatments / Results  Labs (all labs ordered are listed, but only abnormal results are displayed) Labs Reviewed - No data to display  EKG   Radiology No results found.  Procedures Procedures (including critical care time)  Medications Ordered in UC Medications - No data to display  Initial Impression / Assessment and Plan / UC Course  I have reviewed the triage vital signs and the nursing notes.  Pertinent labs & imaging results that were available during my care of the patient were reviewed by me and considered in my medical decision making (see chart for details).   Patient is well-appearing, normotensive, afebrile, not tachycardic, not tachypneic, oxygenating well on room air.    1. Strain of right trapezius muscle, initial encounter Treat with continued NSAID every 8 hours as needed, tizanidine as needed for muscular spasms No red flags in history or on exam Exercises given today Recommended ice, light range of motion/routine exercises Return and ER precautions discussed with patient  The patient was given the opportunity to ask questions.  All questions answered to their satisfaction.  The patient is in agreement to this plan.    Final Clinical Impressions(s) / UC Diagnoses   Final diagnoses:  Strain of right trapezius muscle, initial encounter     Discharge Instructions      As we discussed, I suspect you have a strain of your trapezius muscle.  Please take ibuprofen 800 mg every 8 hours as needed for pain.  You can also take the tizanidine every 8 hours as needed for muscular spasms.   Continue heat/ice, light range of motion/retching exercises.  Have attached exercises to this handout.  Seek care if symptoms persist or worsen despite treatment.    ED Prescriptions     Medication Sig Dispense Auth. Provider   tiZANidine (ZANAFLEX) 4 MG tablet Take 1 tablet (4 mg total) by mouth every 8 (eight) hours as needed for muscle spasms. Do not  take with alcohol or while driving or operating heavy machinery.  May cause drowsiness. 30 tablet Cathlean Marseilles A, NP   ibuprofen (ADVIL) 800 MG tablet Take 1 tablet (800 mg total) by mouth every 8 (eight) hours as needed. Take with food to prevent GI upset 30 tablet Valentino Nose, NP      PDMP not reviewed this encounter.   Valentino Nose, NP 03/13/23 [phone]

## 2023-03-13 NOTE — ED Triage Notes (Signed)
Pt c/o rt shoulder and neck pain for almost 3 wks. States started in mid upper back. Took ibuprofen with no relief. Denies injury.

## 2023-04-03 DIAGNOSIS — E291 Testicular hypofunction: Secondary | ICD-10-CM | POA: Diagnosis not present

## 2023-04-03 DIAGNOSIS — Z79890 Hormone replacement therapy (postmenopausal): Secondary | ICD-10-CM | POA: Diagnosis not present

## 2023-04-05 DIAGNOSIS — E291 Testicular hypofunction: Secondary | ICD-10-CM | POA: Diagnosis not present

## 2023-04-05 DIAGNOSIS — M2550 Pain in unspecified joint: Secondary | ICD-10-CM | POA: Diagnosis not present

## 2023-04-05 DIAGNOSIS — R6882 Decreased libido: Secondary | ICD-10-CM | POA: Diagnosis not present

## 2023-04-05 DIAGNOSIS — Z6824 Body mass index (BMI) 24.0-24.9, adult: Secondary | ICD-10-CM | POA: Diagnosis not present

## 2023-04-09 ENCOUNTER — Encounter: Payer: Self-pay | Admitting: Internal Medicine

## 2023-04-09 ENCOUNTER — Ambulatory Visit: Payer: BC Managed Care – PPO | Admitting: Internal Medicine

## 2023-04-09 VITALS — BP 115/77 | HR 86 | Temp 97.5°F | Ht 67.25 in | Wt 158.8 lb

## 2023-04-09 DIAGNOSIS — R42 Dizziness and giddiness: Secondary | ICD-10-CM

## 2023-04-09 DIAGNOSIS — K4090 Unilateral inguinal hernia, without obstruction or gangrene, not specified as recurrent: Secondary | ICD-10-CM | POA: Diagnosis not present

## 2023-04-09 DIAGNOSIS — S161XXD Strain of muscle, fascia and tendon at neck level, subsequent encounter: Secondary | ICD-10-CM

## 2023-04-09 MED ORDER — GABAPENTIN 300 MG PO CAPS
300.0000 mg | ORAL_CAPSULE | Freq: Three times a day (TID) | ORAL | 3 refills | Status: DC
Start: 2023-04-09 — End: 2024-01-10

## 2023-04-09 MED ORDER — KETOROLAC TROMETHAMINE 10 MG PO TABS: 10.0000 mg | ORAL_TABLET | Freq: Four times a day (QID) | ORAL | 0 refills | Status: DC | PRN

## 2023-04-09 MED ORDER — CYCLOBENZAPRINE HCL 10 MG PO TABS
10.0000 mg | ORAL_TABLET | Freq: Three times a day (TID) | ORAL | 0 refills | Status: DC | PRN
Start: 2023-04-09 — End: 2024-01-10

## 2023-04-09 NOTE — Patient Instructions (Addendum)
It was a pleasure seeing you today! Your health and satisfaction are our top priorities.  Mario Hew, MD  VISIT SUMMARY:  During your visit, we discussed your ongoing neck pain, new onset of lightheadedness, and your known inguinal hernia. We believe your neck pain is due to a condition called torticollis, which is caused by abnormal positioning of the neck during sleep. Your lightheadedness might be related to the dietary supplements you've been taking. As for your hernia, we confirmed its presence through an ultrasound and discussed the possibility of surgical repair.  YOUR PLAN:  -INGUINAL HERNIA: Your inguinal hernia, which is a small bulge in your groin area, doesn't require immediate attention. However, we will refer you to a general surgeon to discuss possible surgical repair. Please avoid heavy lifting to prevent complications.  -LIGHTHEADEDNESS: Your lightheadedness might be due to the dietary supplements you've been taking. We recommend stopping all supplements for a week and monitoring your symptoms. If the lightheadedness persists, we will order a general health panel to rule out other causes. Please increase your fluid intake to prevent dehydration.  -TORTICOLLIS: Your neck pain is likely due to torticollis, a condition caused by abnormal positioning of the neck during sleep. We will prescribe Toradol for inflammation, Flexeril to relax your muscles, and Gabapentin for nerve pain. We recommend using a stiff memory foam pillow for better head support during sleep. If there's no improvement, we will consider physical therapy.  INSTRUCTIONS:  Please stop taking all dietary supplements for a week and monitor your symptoms. Increase your fluid intake to prevent dehydration. Start taking the prescribed medications for your neck pain: Toradol, Flexeril, and Gabapentin. Use a stiff memory foam pillow for better head support during sleep. If your neck pain doesn't improve, we will consider  physical therapy. For your hernia, we will refer you to a general surgeon to discuss possible surgical repair. Please avoid heavy lifting.   NEXT STEPS: [x]  Early Intervention: Schedule sooner appointment, call our on-call services, or go to emergency room if there is any significant Increase in pain or discomfort New or worsening symptoms Sudden or severe changes in your health [x]  Flexible Follow-Up: We recommend a No follow-ups on file. for optimal routine care. This allows for progress monitoring and treatment adjustments. [x]  Preventive Care: Schedule your annual preventive care visit! It's typically covered by insurance and helps identify potential health issues early. [x]  Lab & X-ray Appointments: Incomplete tests scheduled today, or call to schedule. X-rays: [facility] (M-F, 8:30am-noon or 1pm-5pm). [x]  Medical Information Release: Sign a release form at front desk to obtain relevant medical information we don't have.  MAKING THE MOST OF OUR FOCUSED 20 MINUTE APPOINTMENTS: [x]   Clearly state your top concerns at the beginning of the visit to focus our discussion [x]   If you anticipate you will need more time, please inform the front desk during scheduling - we can book multiple appointments in the same week. [x]   If you have transportation problems- use our convenient video appointments or ask about transportation support. [x]   We can get down to business faster if you use MyChart to update information before the visit and submit non-urgent questions before your visit. Thank you for taking the time to provide details through MyChart.  Let our nurse know and she can import this information into your encounter documents.  Arrival and Wait Times: [x]   Arriving on time ensures that everyone receives prompt attention. [x]   Early morning (8a) and afternoon (1p) appointments  tend to have shortest wait times. [x]   Unfortunately, we cannot delay appointments for late arrivals  or hold slots during phone calls.  Getting Answers and Following Up [x]   Simple Questions & Concerns: For quick questions or basic follow-up after your visit, reach [place] at [phone] or MyChart messaging. [x]   Complex Concerns: If your concern is more complex, scheduling an appointment might be best. Discuss this with the staff to find the most suitable option. [x]   Lab & Imaging Results: We'll contact you directly if results are abnormal or you don't use MyChart. Most normal results will be on MyChart within 2-3 business days, with a review message from Dr. Jon Billings. Haven't heard back in 2 weeks? Need results sooner? Contact us at [phone]. [x]   Referrals: Our referral coordinator will manage specialist referrals. The specialist's office should contact you within 2 weeks to schedule an appointment. Call us if you haven't heard from them after 2 weeks.  Staying Connected [x]   MyChart: Activate your MyChart for the fastest way to access results and message [place]. See the last page of this paperwork for instructions on how to activate.  Bring to Your Next Appointment [x]   Medications: Please bring all your medication bottles to your next appointment to ensure we have an accurate record of your prescriptions. [x]   Health Diaries: If you're monitoring any health conditions at home, keeping a diary of your readings can be very helpful for discussions at your next appointment.  Billing [x]   X-ray & Lab Orders: These are billed by separate companies. Contact the invoicing company directly for questions or concerns. [x]   Visit Charges: Discuss any billing inquiries with our administrative services team.  Your Satisfaction Matters [x]   Share Your Experience: We strive for your satisfaction! If you have any complaints, or preferably compliments, please let Dr. Jon Billings know directly or contact our Practice Administrators, Edwena Felty or [employer], by asking at the front desk.   Reviewing  Your Records [x]   Review this early draft of your clinical encounter notes below and the final encounter summary tomorrow on MyChart after its been completed.  All orders placed so far are visible here: Neck strain, subsequent encounter -     Ketorolac Tromethamine; Take 1 tablet (10 mg total) by mouth every 6 (six) hours as needed.  Dispense: 20 tablet; Refill: 0 -     Cyclobenzaprine HCl; Take 1 tablet (10 mg total) by mouth 3 (three) times daily as needed for muscle spasms.  Dispense: 30 tablet; Refill: 0 -     Gabapentin; Take 1 capsule (300 mg total) by mouth 3 (three) times daily.  Dispense: 90 capsule; Refill: 3  Lightheadedness -     TSH -     Comprehensive metabolic panel -     CBC with Differential/Platelet  Inguinal hernia, left -     Ambulatory referral to General Surgery

## 2023-04-09 NOTE — Progress Notes (Signed)
[facility]: [phone]   -- Medical Office Visit --  Patient:  Mario Cain      Age: 47 y.o.       Sex:  male  Date:   04/09/2023 Patient Care Team: Lula Olszewski, MD as PCP - General (Internal Medicine) Today's Healthcare Provider: Lula Olszewski, MD      Assessment & Plan Neck strain, subsequent encounter The torticollis, likely due to his sleeping position, causes nerve pinching and muscle spasms, with pain worsening in the morning and improving with movement. We will prescribe Toradol for its anti-inflammatory effect for a maximum of five days to prevent stomach ulcers, Flexeril as a muscle relaxer, and Gabapentin for nerve pain. A stiff memory foam pillow is recommended for better head support during sleep, and we will consider physical therapy if there is no improvement. We will consider acupuncture for faster recovery from torticollis.  Lightheadedness Will hold supplements to see, bloodwork if it doesn't go away. Increased  fluid intake advised  Supplements to hold: suspicious for being the cause Nattokinase serrapeptase lumbrokinase bromelain, amla & rutin Alha gpc, phsphatidylserine, bacopa monnieri, huperzine-a ginkgo billoba.   His lightheadedness, a new onset possibly related to recent supplement use, shows no clear connection to neck pain. We will stop all supplements for one week and monitor for symptom resolution. A general health panel will be ordered if lightheadedness persists to rule out other causes, and we recommend increasing fluid intake to prevent dehydration.  Orders for labs are placed but he can skip completing if supplement discontinuation resolves the problem Inguinal hernia, left Refer care to surgery after today we had brief discussion risks and benefits and elective management / conservative management options. Inguinal Hernia Confirmed by ultrasound on August 6th, the inguinal hernia presents no current urgency, though the  risk of strangulation with heavy lifting was discussed. We will refer him to a general surgeon for possible surgical repair and advise against heavy lifting. Gave AVS handout discussed strangulation and weight lifting restriction.     Diagnoses and all orders for this visit: Neck strain, subsequent encounter -     ketorolac (TORADOL) 10 MG tablet; Take 1 tablet (10 mg total) by mouth every 6 (six) hours as needed. -     cyclobenzaprine (FLEXERIL) 10 MG tablet; Take 1 tablet (10 mg total) by mouth 3 (three) times daily as needed for muscle spasms. -     gabapentin (NEURONTIN) 300 MG capsule; Take 1 capsule (300 mg total) by mouth 3 (three) times daily. Lightheadedness -     TSH -     Comprehensive metabolic panel -     CBC with Differential/Platelet Inguinal hernia, left -     Ambulatory referral to General Surgery  Recommended follow-up: No follow-ups on file. Future Appointments  Date Time Provider Department Center  04/18/2023  3:00 PM Lula Olszewski, MD [facility]            Subjective   47 y.o. male who has Hypogonadism in male; Incomplete emptying of bladder; Fatigue; Abdominal swelling; Internal hemorrhoids; and Hyperlipidemia on their problem list. His reasons/main concerns/chief complaints for today's office visit are Discuss imaging results and referral   ------------------------------------------------------------------------------------------------------------------------ AI-Extracted: Discussed the use of AI scribe software for clinical note transcription with the patient, who gave verbal consent to proceed.  History of Present Illness   The patient, with a known history of inguinal hernia, presents with a three-week history of neck and trapezius pain, which has  now extended to both sides. The pain was initially severe and woke him from sleep. He also reports numbness radiating down the shoulder. He sought care at an urgent care center, where it was suggested that the  pain might be due to poor posture from prolonged sitting. Despite this, the pain persists, albeit less intense, and is worse in the mornings. It tends to improve with movement but can recur with certain positions, such as sitting on the couch.  In addition to the neck pain, he reports a new onset of lightheadedness that started the day prior to the consultation. The lightheadedness is particularly noticeable when bending over and during ambulation. He also notes that he had been taking several dietary supplements for about three weeks before the onset of the lightheadedness. He had also started taking L-citrulline the day before the lightheadedness began.  His known inguinal hernia was confirmed by ultrasound. He reports a desire to have it surgically repaired, despite it being a small bulge currently. He denies any history of heart attack and is not on blood thinners. He also denies any heavy lifting activities.  He has been taking prescribed ibuprofen and muscle relaxers for the neck pain, but reports minimal relief. He has been managing the pain with heat and ice application and has considered physical therapy. He also expresses interest in trying acupuncture for pain relief.      He has a past medical history of Allergy and High cholesterol.  Problem list overviews that were updated at today's visit: No problems updated. Current Outpatient Medications on File Prior to Visit  Medication Sig   CLOMID 50 MG tablet Take by mouth.   COD LIVER OIL PO Take 1,000 mg by mouth daily.   ibuprofen (ADVIL) 800 MG tablet Take 1 tablet (800 mg total) by mouth every 8 (eight) hours as needed. Take with food to prevent GI upset   Multiple Vitamin (MULTIVITAMIN PO) Take 2 each by mouth daily. Gummies.   tiZANidine (ZANAFLEX) 4 MG tablet Take 1 tablet (4 mg total) by mouth every 8 (eight) hours as needed for muscle spasms. Do not take with alcohol or while driving or operating heavy machinery.  May cause  drowsiness.   No current facility-administered medications on file prior to visit.  There are no discontinued medications.   Objective   Physical Exam  BP 115/77 (BP Location: Left Arm, Patient Position: Sitting)   Pulse 86   Temp (!) 97.5 F (36.4 C) (Temporal)   Ht 5' 7.25" (1.708 m)   Wt 158 lb 12.8 oz (72 kg)   SpO2 97%   BMI 24.69 kg/m  Wt Readings from Last 10 Encounters:  04/09/23 158 lb 12.8 oz (72 kg)  02/20/23 159 lb 9.6 oz (72.4 kg)  02/05/23 161 lb (73 kg)  02/13/22 164 lb 14.5 oz (74.8 kg)  04/23/21 165 lb (74.8 kg)  02/09/21 165 lb (74.8 kg)  07/10/20 158 lb (71.7 kg)   Vital signs reviewed.  Nursing notes reviewed. Weight trend reviewed. Abnormalities and Problem-Specific physical exam findings:  neck stiffness mild to moderate.  General Appearance:  No acute distress appreciable.   Well-groomed, healthy-appearing male.  Well proportioned with no abnormal fat distribution.  Good muscle tone. Pulmonary:  Normal work of breathing at rest, no respiratory distress apparent. SpO2: 97 %  Musculoskeletal: All extremities are intact.  Neurological:  Awake, alert, oriented, and engaged.  No obvious focal neurological deficits or cognitive impairments.  Sensorium seems unclouded.   Speech is  clear and coherent with logical content. Psychiatric:  Appropriate mood, pleasant and cooperative demeanor, thoughtful and engaged during the exam  Results   RADIOLOGY Ultrasound: Confirmed inguinal hernia (02/27/2023)        No results found for any visits on 04/09/23.  Scanned Document on 02/08/2023  Component Date Value   HM Colonoscopy 11/14/2021 See Report (in chart)   Lab on 02/06/2023  Component Date Value   WBC 02/06/2023 4.6    RBC 02/06/2023 5.36    Platelets 02/06/2023 264.0    Hemoglobin 02/06/2023 16.1    HCT 02/06/2023 47.4    MCV 02/06/2023 88.4    MCHC 02/06/2023 34.0    RDW 02/06/2023 13.3    Sodium 02/06/2023 136    Potassium 02/06/2023 3.8     Chloride 02/06/2023 102    CO2 02/06/2023 26    Glucose, Bld 02/06/2023 113 (H)    BUN 02/06/2023 14    Creatinine, Ser 02/06/2023 1.06    Total Bilirubin 02/06/2023 0.6    Alkaline Phosphatase 02/06/2023 52    AST 02/06/2023 17    ALT 02/06/2023 27    Total Protein 02/06/2023 7.4    Albumin 02/06/2023 4.5    GFR 02/06/2023 83.51    Calcium 02/06/2023 9.7    TSH 02/06/2023 2.28    Cholesterol 02/06/2023 240 (H)    Triglycerides 02/06/2023 183.0 (H)    HDL 02/06/2023 31.10 (L)    VLDL 02/06/2023 36.6    LDL Cholesterol 02/06/2023 173 (H)    Total CHOL/HDL Ratio 02/06/2023 8    NonHDL 02/06/2023 209.28   Office Visit on 02/05/2023  Component Date Value   Color, Urine 02/05/2023 YELLOW    APPearance 02/05/2023 CLEAR    Specific Gravity, Urine 02/05/2023 <=1.005 (A)    pH 02/05/2023 7.0    Total Protein, Urine 02/05/2023 NEGATIVE    Urine Glucose 02/05/2023 NEGATIVE    Ketones, ur 02/05/2023 NEGATIVE    Bilirubin Urine 02/05/2023 NEGATIVE    Hgb urine dipstick 02/05/2023 NEGATIVE    Urobilinogen, UA 02/05/2023 0.2    Leukocytes,Ua 02/05/2023 NEGATIVE    Nitrite 02/05/2023 NEGATIVE    WBC, UA 02/05/2023 0-2/hpf    RBC / HPF 02/05/2023 none seen   Admission on 04/29/2022, Discharged on 04/29/2022  Component Date Value   Sodium 04/29/2022 137    Potassium 04/29/2022 4.1    Chloride 04/29/2022 100    CO2 04/29/2022 29    Glucose, Bld 04/29/2022 86    BUN 04/29/2022 18    Creatinine, Ser 04/29/2022 1.38 (H)    Calcium 04/29/2022 10.0    GFR, Estimated 04/29/2022 >60    Anion gap 04/29/2022 8    WBC 04/29/2022 6.5    RBC 04/29/2022 5.48    Hemoglobin 04/29/2022 17.2 (H)    HCT 04/29/2022 48.3    MCV 04/29/2022 88.1    MCH 04/29/2022 31.4    MCHC 04/29/2022 35.6    RDW 04/29/2022 12.8    Platelets 04/29/2022 292    nRBC 04/29/2022 0.0    Neutrophils Relative % 04/29/2022 40    Neutro Abs 04/29/2022 2.6    Lymphocytes Relative 04/29/2022 46    Lymphs Abs 04/29/2022  3.0    Monocytes Relative 04/29/2022 8    Monocytes Absolute 04/29/2022 0.5    Eosinophils Relative 04/29/2022 5    Eosinophils Absolute 04/29/2022 0.3    Basophils Relative 04/29/2022 1    Basophils Absolute 04/29/2022 0.1    Immature Granulocytes 04/29/2022 0    Abs  Immature Granulocytes 04/29/2022 0.02    Color, Urine 04/29/2022 YELLOW    APPearance 04/29/2022 CLEAR    Specific Gravity, Urine 04/29/2022 1.023    pH 04/29/2022 6.5    Glucose, UA 04/29/2022 NEGATIVE    Hgb urine dipstick 04/29/2022 NEGATIVE    Bilirubin Urine 04/29/2022 NEGATIVE    Ketones, ur 04/29/2022 15 (A)    Protein, ur 04/29/2022 NEGATIVE    Nitrite 04/29/2022 NEGATIVE    Leukocytes,Ua 04/29/2022 SMALL (A)    RBC / HPF 04/29/2022 0-5    WBC, UA 04/29/2022 6-10    Bacteria, UA 04/29/2022 RARE (A)    Mucus 04/29/2022 PRESENT    No image results found.   US PELVIS LIMITED (TRANSABDOMINAL ONLY)  Result Date: 03/04/2023 CLINICAL DATA:  Left inguinal hernia, bulging EXAM: LIMITED ULTRASOUND OF PELVIS TECHNIQUE: Limited transabdominal ultrasound examination of the pelvis was performed. COMPARISON:  None Available. FINDINGS: Sonographic evaluation of the left inguinal region was performed at the site of the palpable abnormality. There is a small fat containing left inguinal hernia identified during Valsalva maneuver, with the hernia neck measuring 12 mm in diameter. No evidence of bowel herniation. IMPRESSION: 1. Small fat containing left inguinal hernia.  No bowel herniation. Electronically Signed   By: Sharlet Salina M.D.   On: 03/04/2023 16:30    No results found.     Additional Info: This encounter employed real-time, collaborative documentation. The patient actively reviewed and updated their medical record on a shared screen, ensuring transparency and facilitating joint problem-solving for the problem list, overview, and plan. This approach promotes accurate, informed care. The treatment plan was discussed and  reviewed in detail, including medication safety, potential side effects, and all patient questions. We confirmed understanding and comfort with the plan. Follow-up instructions were established, including contacting the office for any concerns, returning if symptoms worsen, persist, or new symptoms develop, and precautions for potential emergency department visits.

## 2023-04-18 ENCOUNTER — Ambulatory Visit: Payer: BC Managed Care – PPO | Admitting: Internal Medicine

## 2023-05-04 ENCOUNTER — Ambulatory Visit: Payer: Self-pay | Admitting: Surgery

## 2023-05-04 DIAGNOSIS — K4090 Unilateral inguinal hernia, without obstruction or gangrene, not specified as recurrent: Secondary | ICD-10-CM | POA: Diagnosis not present

## 2023-05-04 NOTE — H&P (Signed)
Subjective    Chief Complaint: New Consultation       History of Present Illness: Mario Cain is a 47 y.o. male who is seen today as an office consultation at the request of Dr. Jon Billings for evaluation of New Consultation .     This is a 47 year old male in excellent health who presents with a 2-year history of a slowly enlarging bulge in his left groin.  The patient is quite active and works out regularly.  He has noticed some worsening discomfort in his left groin.  When he is supine the discomfort resolves.  He denies any GI obstructive symptoms.  He does report a lot of urinary frequency and nocturia.  He feels that he never is able to completely empty his bladder.  He states that he did have a normal PSA performed recently.     Review of Systems: A complete review of systems was obtained from the patient.  I have reviewed this information and discussed as appropriate with the patient.  See HPI as well for other ROS.   Review of Systems  Constitutional: Negative.   HENT: Negative.    Eyes: Negative.   Respiratory: Negative.    Cardiovascular: Negative.   Gastrointestinal: Negative.   Genitourinary: Negative.   Musculoskeletal: Negative.   Skin: Negative.   Neurological: Negative.   Endo/Heme/Allergies: Negative.   Psychiatric/Behavioral: Negative.          Medical History: Past Medical History      Past Medical History:  Diagnosis Date   Allergy     High cholesterol          Problem List     Patient Active Problem List  Diagnosis   Abdominal swelling   Fatigue   Hyperlipidemia   Hypogonadism in male   Incomplete emptying of bladder   Internal hemorrhoids   Left inguinal hernia        Past Surgical History  History reviewed. No pertinent surgical history.      Allergies       Allergies  Allergen Reactions   Egg Derived Unknown   Other Other (See Comments)      Various pollen and trees.        Medications Ordered Prior to Encounter         Current Outpatient Medications on File Prior to Visit  Medication Sig Dispense Refill   CLOMID 50 mg tablet Take by mouth        No current facility-administered medications on file prior to visit.        Family History       Family History  Problem Relation Age of Onset   Cancer Mother     Hyperlipidemia (Elevated cholesterol) Mother          Tobacco Use History  Social History       Tobacco Use  Smoking Status Never  Smokeless Tobacco Never        Social History  Social History        Socioeconomic History   Marital status: Married  Tobacco Use   Smoking status: Never   Smokeless tobacco: Never  Substance and Sexual Activity   Alcohol use: Never   Drug use: Never    Social Determinants of Health        Financial Resource Strain: Low Risk  (02/19/2023)    Received from [facility]    Overall Financial Resource Strain (CARDIA)     Difficulty of Paying Living Expenses: Not hard at  all  Food Insecurity: No Food Insecurity (02/19/2023)    Received from [facility]    Hunger Vital Sign     Worried About Running Out of Food in the Last Year: Never true     Ran Out of Food in the Last Year: Never true  Transportation Needs: No Transportation Needs (02/19/2023)    Received from [facility] - Transportation     Lack of Transportation (Medical): No     Lack of Transportation (Non-Medical): No  Physical Activity: Insufficiently Active (02/19/2023)    Received from [facility]    Exercise Vital Sign     Days of Exercise per Week: 4 days     Minutes of Exercise per Session: 30 min  Stress: No Stress Concern Present (02/19/2023)    Received from [facility] - Occupational Stress Questionnaire     Feeling of Stress : Not at all  Social Connections: Moderately Integrated (02/19/2023)    Received from [facility]    Social Connection and Isolation Panel [NHANES]     Frequency of Communication with Friends and  Family: Three times a week     Frequency of Social Gatherings with Friends and Family: Once a week     Attends Religious Services: 1 to 4 times per year     Active Member of Clubs or Organizations: No     Marital Status: Married        Objective:          Vitals:    05/04/23 0926 05/04/23 0929  BP: 138/88    Pulse: 83    Temp: 36.8 C (98.3 F)    SpO2: 98%    Weight: 73.8 kg (162 lb 9.6 oz)    Height: 170.2 cm (5\' 7" )    PainSc:   0-No pain  PainLoc:   Groin    Body mass index is 25.47 kg/m.   Physical Exam    Constitutional:  WDWN in NAD, conversant, no obvious deformities; lying in bed comfortably Eyes:  Pupils equal, round; sclera anicteric; moist conjunctiva; no lid lag HENT:  Oral mucosa moist; good dentition  Neck:  No masses palpated, trachea midline; no thyromegaly Lungs:  CTA bilaterally; normal respiratory effort CV:  Regular rate and rhythm; no murmurs; extremities well-perfused with no edema Abd:  +bowel sounds, soft, non-tender, no palpable organomegaly; no palpable hernias GU: Bilateral descended testes, no testicular masses, small reducible left inguinal hernia.  No sign of right inguinal hernia. Musc: Normal gait; no apparent clubbing or cyanosis in extremities Lymphatic:  No palpable cervical or axillary lymphadenopathy Skin:  Warm, dry; no sign of jaundice Psychiatric - alert and oriented x 4; calm mood and affect     Assessment and Plan:  Diagnoses and all orders for this visit:   Non-recurrent unilateral inguinal hernia without obstruction or gangrene       Recommend left inguinal hernia repair with mesh.The surgical procedure has been discussed with the patient.  Potential risks, benefits, alternative treatments, and expected outcomes have been explained.  All of the patient's questions at this time have been answered.  The likelihood of reaching the patient's treatment goal is good.  The patient understands the proposed surgical procedure and  wishes to proceed.     The patient's urinary symptoms may be related to prostate issues.  I asked him to speak with his primary care physician about a referral to urology for further  evaluation.  It is not likely that the small hernia is the source of his bladder issues.   Duron Meister Delbert Harness, MD  05/04/2023 11:22 AM

## 2023-05-11 ENCOUNTER — Other Ambulatory Visit: Payer: Self-pay

## 2023-05-11 DIAGNOSIS — R339 Retention of urine, unspecified: Secondary | ICD-10-CM

## 2023-06-04 ENCOUNTER — Encounter: Payer: Self-pay | Admitting: Internal Medicine

## 2023-06-11 ENCOUNTER — Encounter: Payer: Self-pay | Admitting: Internal Medicine

## 2023-07-30 DIAGNOSIS — Z125 Encounter for screening for malignant neoplasm of prostate: Secondary | ICD-10-CM | POA: Diagnosis not present

## 2023-07-30 DIAGNOSIS — E349 Endocrine disorder, unspecified: Secondary | ICD-10-CM | POA: Diagnosis not present

## 2023-07-30 DIAGNOSIS — R3915 Urgency of urination: Secondary | ICD-10-CM | POA: Diagnosis not present

## 2023-08-06 DIAGNOSIS — Z79890 Hormone replacement therapy (postmenopausal): Secondary | ICD-10-CM | POA: Diagnosis not present

## 2023-08-06 DIAGNOSIS — E291 Testicular hypofunction: Secondary | ICD-10-CM | POA: Diagnosis not present

## 2023-08-08 DIAGNOSIS — M2550 Pain in unspecified joint: Secondary | ICD-10-CM | POA: Diagnosis not present

## 2023-08-08 DIAGNOSIS — Z6826 Body mass index (BMI) 26.0-26.9, adult: Secondary | ICD-10-CM | POA: Diagnosis not present

## 2023-08-08 DIAGNOSIS — E291 Testicular hypofunction: Secondary | ICD-10-CM | POA: Diagnosis not present

## 2023-08-08 DIAGNOSIS — G479 Sleep disorder, unspecified: Secondary | ICD-10-CM | POA: Diagnosis not present

## 2023-09-03 DIAGNOSIS — F1020 Alcohol dependence, uncomplicated: Secondary | ICD-10-CM | POA: Diagnosis not present

## 2023-09-17 DIAGNOSIS — F1020 Alcohol dependence, uncomplicated: Secondary | ICD-10-CM | POA: Diagnosis not present

## 2023-09-18 DIAGNOSIS — F1020 Alcohol dependence, uncomplicated: Secondary | ICD-10-CM | POA: Diagnosis not present

## 2023-09-19 ENCOUNTER — Ambulatory Visit (INDEPENDENT_AMBULATORY_CARE_PROVIDER_SITE_OTHER): Payer: BC Managed Care – PPO | Admitting: Internal Medicine

## 2023-09-19 ENCOUNTER — Encounter: Payer: Self-pay | Admitting: Internal Medicine

## 2023-09-19 VITALS — BP 121/76 | HR 70 | Temp 98.8°F | Ht 67.25 in | Wt 172.2 lb

## 2023-09-19 DIAGNOSIS — E559 Vitamin D deficiency, unspecified: Secondary | ICD-10-CM | POA: Diagnosis not present

## 2023-09-19 DIAGNOSIS — E782 Mixed hyperlipidemia: Secondary | ICD-10-CM | POA: Diagnosis not present

## 2023-09-19 DIAGNOSIS — E663 Overweight: Secondary | ICD-10-CM | POA: Diagnosis not present

## 2023-09-19 DIAGNOSIS — E291 Testicular hypofunction: Secondary | ICD-10-CM

## 2023-09-19 DIAGNOSIS — R739 Hyperglycemia, unspecified: Secondary | ICD-10-CM

## 2023-09-19 DIAGNOSIS — Z0000 Encounter for general adult medical examination without abnormal findings: Secondary | ICD-10-CM

## 2023-09-19 DIAGNOSIS — H1013 Acute atopic conjunctivitis, bilateral: Secondary | ICD-10-CM

## 2023-09-19 DIAGNOSIS — R339 Retention of urine, unspecified: Secondary | ICD-10-CM | POA: Diagnosis not present

## 2023-09-19 DIAGNOSIS — Z125 Encounter for screening for malignant neoplasm of prostate: Secondary | ICD-10-CM

## 2023-09-19 DIAGNOSIS — F1020 Alcohol dependence, uncomplicated: Secondary | ICD-10-CM | POA: Diagnosis not present

## 2023-09-19 LAB — CBC WITH DIFFERENTIAL/PLATELET
Basophils Absolute: 0.1 10*3/uL (ref 0.0–0.1)
Basophils Relative: 1 % (ref 0.0–3.0)
Eosinophils Absolute: 0.2 10*3/uL (ref 0.0–0.7)
Eosinophils Relative: 4.2 % (ref 0.0–5.0)
HCT: 46.2 % (ref 39.0–52.0)
Hemoglobin: 15.8 g/dL (ref 13.0–17.0)
Lymphocytes Relative: 47.2 % — ABNORMAL HIGH (ref 12.0–46.0)
Lymphs Abs: 2.6 10*3/uL (ref 0.7–4.0)
MCHC: 34.2 g/dL (ref 30.0–36.0)
MCV: 90.9 fl (ref 78.0–100.0)
Monocytes Absolute: 0.5 10*3/uL (ref 0.1–1.0)
Monocytes Relative: 9.4 % (ref 3.0–12.0)
Neutro Abs: 2.1 10*3/uL (ref 1.4–7.7)
Neutrophils Relative %: 38.2 % — ABNORMAL LOW (ref 43.0–77.0)
Platelets: 228 10*3/uL (ref 150.0–400.0)
RBC: 5.08 Mil/uL (ref 4.22–5.81)
RDW: 13.2 % (ref 11.5–15.5)
WBC: 5.6 10*3/uL (ref 4.0–10.5)

## 2023-09-19 LAB — COMPREHENSIVE METABOLIC PANEL
ALT: 25 U/L (ref 0–53)
AST: 21 U/L (ref 0–37)
Albumin: 5 g/dL (ref 3.5–5.2)
Alkaline Phosphatase: 43 U/L (ref 39–117)
BUN: 13 mg/dL (ref 6–23)
CO2: 27 meq/L (ref 19–32)
Calcium: 10.2 mg/dL (ref 8.4–10.5)
Chloride: 101 meq/L (ref 96–112)
Creatinine, Ser: 1.13 mg/dL (ref 0.40–1.50)
GFR: 77.01 mL/min (ref >60.00)
Glucose, Bld: 87 mg/dL (ref 70–99)
Potassium: 4.2 meq/L (ref 3.5–5.1)
Sodium: 138 meq/L (ref 135–145)
Total Bilirubin: 0.6 mg/dL (ref 0.2–1.2)
Total Protein: 7.9 g/dL (ref 6.0–8.3)

## 2023-09-19 LAB — LIPID PANEL
Cholesterol: 268 mg/dL — ABNORMAL HIGH (ref 0–200)
HDL: 38.2 mg/dL — ABNORMAL LOW (ref >39.00)
LDL Cholesterol: 197 mg/dL — ABNORMAL HIGH (ref 0–99)
NonHDL: 229.92
Total CHOL/HDL Ratio: 7
Triglycerides: 163 mg/dL — ABNORMAL HIGH (ref 0.0–149.0)
VLDL: 32.6 mg/dL (ref 0.0–40.0)

## 2023-09-19 LAB — PSA: PSA: 1.33 ng/mL (ref 0.10–4.00)

## 2023-09-19 LAB — HEMOGLOBIN A1C: Hgb A1c MFr Bld: 5.7 % (ref 4.6–6.5)

## 2023-09-19 LAB — VITAMIN D 25 HYDROXY (VIT D DEFICIENCY, FRACTURES): VITD: 62.36 ng/mL (ref 30.00–100.00)

## 2023-09-19 NOTE — Progress Notes (Unsigned)
 -- Annual Preventive Medical Office Visit --  Patient:  Mario Cain      Age: 48 y.o.       Sex:  male  Date:   09/19/2023 Patient Care Team: Lula Olszewski, MD as PCP - General (Internal Medicine) Today's Healthcare Provider: Lula Olszewski, MD  ========================================= Chief complaint: Annual Exam  Purpose of Visit: Comprehensive preventive health assessment and personalized health maintenance planning.  This encounter was conducted as a Comprehensive Physical Exam (CPE) preventive care annual visit. The patient's medical history and problem list were reviewed to inform individualized preventive care recommendations.  No problem-specific medical treatment was provided during this visit.    Assessment & Plan Encounter for annual health examination Routine annual physical examination with no significant findings. Emphasized the importance of preventive health measures and proactive health management to prevent future complications. - Perform general health panel, metabolic panel, thyroid function test, and cholesterol check - Discuss results and recommendations within a week Mixed hyperlipidemia Cholesterol levels are currently good, but there is a risk of cardiovascular events due to high cholesterol. Discussed the importance of maintaining a healthy diet and lifestyle to manage cholesterol levels and prevent heart disease. Emphasized that while dietary changes can help, medication may be necessary if levels do not improve, to prevent heart attacks and other cardiovascular events. - Order CT coronary calcium score to assess for coronary artery disease - Discuss potential need for medication if cholesterol levels are not improved with lifestyle changes Incomplete emptying of bladder Will order lab testing to guide management (PSA) Hypogonadism in male Previously had elevated hemoglobin levels, which may be related to testosterone replacement therapy. The therapy has  been reduced, and the hemoglobin levels have decreased to a healthier range. Plan to recheck the hemoglobin levels to ensure they remain stable. - Recheck hemoglobin levels Overweight BMI is high, labeling the patient as overweight for insurance purposes, which can facilitate screening for diabetes and other conditions. Despite high BMI, the patient has a healthy body composition with significant muscle mass. Discussed that while BMI is high, the patient's overall health is not concerning. - Order diabetes screening as part of the general health panel BMI Readings from Last 3 Encounters:  09/19/23 26.77 kg/m  04/09/23 24.69 kg/m  02/20/23 25.00 kg/m   Vitamin D deficiency Will order lab testing to guide management.  Allergic conjunctivitis of both eyes Reports itchy eyes, which may be related to allergies. Has been using over-the-counter Opcon A, which helps with redness but not the itchiness. Suggested trying Pataday, which is similar and may be more effective. Advised that if symptoms persist, an allergist referral may be necessary. - Recommend Pataday for itchy eyes - Advise removal of plants from the house - Consider referral to an allergist if symptoms persist Hyperglycemia Will order lab testing to guide management.  Prostate cancer screening encounter, options and risks discussed The natural history of prostate cancer and ongoing controversy regarding screening and potential treatment outcomes of prostate cancer has been discussed with the patient. The meaning of a false positive PSA and a false negative PSA has been discussed. He indicates understanding of the limitations of this screening test and wished to proceed with screening PSA testing.     Diagnoses and all orders for this visit: Encounter for annual health examination -     Lipid panel -     Comprehensive metabolic panel -     CBC with Differential/Platelet -     TSH Rfx on  Abnormal to Free T4 -     PSA -     HgB  A1c -     Vitamin D (25 hydroxy) -     CT CARDIAC SCORING (SELF PAY ONLY); Future Mixed hyperlipidemia -     Lipid panel -     CT CARDIAC SCORING (SELF PAY ONLY); Future Incomplete emptying of bladder Hypogonadism in male Overweight -     HgB A1c Vitamin D deficiency -     Vitamin D (25 hydroxy) Allergic conjunctivitis of both eyes Hyperglycemia Prostate cancer screening encounter, options and risks discussed -     PSA   Reviewed/updated/encouraged completion:  There is no immunization history on file for this patient. There are no preventive care reminders to display for this patient. Health Maintenance  Topic Date Due   INFLUENZA VACCINE  10/22/2023 (Originally 02/22/2023)   Hepatitis C Screening  02/05/2024 (Originally 06/26/1993)   HIV Screening  02/05/2024 (Originally 06/26/1990)   Colonoscopy  11/15/2031   HPV VACCINES  Aged Out   DTaP/Tdap/Td  Discontinued   COVID-19 Vaccine  Discontinued   Today's Health Maintenance Counseling and Anticipatory Guidance:  Eye exams:  every 1-2 years Dental cleanings: every 6 months or more, brush/floss 3x daily Sinus Care: saline spray rinses daily Sleep: 8 hr nightly, good sleep hygiene, If unrestful, use e-monitoring Diet:  fruits/vegetables/fiber/healthy fats, balance and moderation Exercise:  150 minutes/weekly Discouraged any/all high risk behaviors  Today's Cancer Screening Shared Decision Making Discussions: Penile/Testicle/Scrotum: encouraged self-monitoring and reporting of genital abnormalities. Patient reports there are none. Thyroid:  checked and advised to check by palpating thyroid for nodules Prostate:  The natural history of prostate cancer and ongoing controversy regarding screening and potential treatment outcomes of prostate cancer has been discussed with the patient. The meaning of a false positive PSA and a false negative PSA has been discussed. He indicates understanding of the limitations of this screening test  and wished  to proceed with screening PSA testing. Colonoscopy  Next due 11/15/2031  Lung:  Current guidelines recommend Individuals aged 81 to 28 who currently smoke or formerly smoked and have a >= 20 pack-year smoking history should undergo annual screening with low-dose computed tomography (LDCT). Skin: Advised regular sunscreen use. He denies worrisome, changing, or new skin lesions. Offered to include images in chart for surveillance. Showed him pictures of melanomas for reference to educate for self-monitoring. Other: discussed lack of screening guidelines and insurance coverage for other types.     Subjective  HPI Discussed the use of AI scribe software for clinical note transcription with the patient, who gave verbal consent to proceed.  History of Present Illness The patient presents for an annual physical exam and cholesterol check.  They have not had a physical exam in over a year, with the last visit being for a neck strain six months ago and an unrelated issue in July. They are focused on maintaining a healthy lifestyle and monitoring their cholesterol levels.  They have been attempting to improve their diet by reducing processed foods, sugars, and fatty foods, while increasing cardio exercises. No current fatigue, which was previously an issue.  They have a history of high cholesterol, which was previously discussed in detail. They are managing their cholesterol through diet and exercise, with an emphasis on healthy fats like fish. They are not currently on medication for cholesterol.  They have a history of high hemoglobin levels, which may be related to previous testosterone replacement therapy. They are currently on a  lower dose of testosterone, which has decreased their levels to a healthier range.  They report itchy eyes, for which they use over-the-counter medications like Opcon A. They have tried other remedies like aloe, but they burn. They have allergies, including  seasonal ones, and are considering seeing an allergist if symptoms persist.  They have a history of a hernia, for which they have consulted a Careers adviser. They have not had surgery yet but are aware of the importance of avoiding heavy lifting. They have been focusing on cardio exercises and bodyweight training instead.  No sleep apnea symptoms, such as waking up gasping or having bad snoring issues. They do not feel drowsy during the day, although their wife has mentioned occasional snoring when they are extremely tired.  Past Medical History - Neck strain 6 months ago - Stomach pain last July - Abdominal swelling last July - History of fatigue - History of high hemoglobin - History of high cholesterol - History of urinary tract infection - History of snoring  ROS A comprehensive ROS was negative for any concerning symptoms.    Completed medication reconciliation: Current Outpatient Medications on File Prior to Visit  Medication Sig   CLOMID 50 MG tablet Take by mouth.   COD LIVER OIL PO Take 1,000 mg by mouth daily.   cyclobenzaprine (FLEXERIL) 10 MG tablet Take 1 tablet (10 mg total) by mouth 3 (three) times daily as needed for muscle spasms.   gabapentin (NEURONTIN) 300 MG capsule Take 1 capsule (300 mg total) by mouth 3 (three) times daily.   ibuprofen (ADVIL) 800 MG tablet Take 1 tablet (800 mg total) by mouth every 8 (eight) hours as needed. Take with food to prevent GI upset   ketorolac (TORADOL) 10 MG tablet Take 1 tablet (10 mg total) by mouth every 6 (six) hours as needed.   Multiple Vitamin (MULTIVITAMIN PO) Take 2 each by mouth daily. Gummies.   tiZANidine (ZANAFLEX) 4 MG tablet Take 1 tablet (4 mg total) by mouth every 8 (eight) hours as needed for muscle spasms. Do not take with alcohol or while driving or operating heavy machinery.  May cause drowsiness.   No current facility-administered medications on file prior to visit.  There are no discontinued medications.The following were  reviewed and/or entered/updated into our electronic MEDICAL RECORD NUMBERPast Medical History:  Diagnosis Date   Allergy    High cholesterol    Past Surgical History:  Procedure Laterality Date   arthritis     Social History   Socioeconomic History   Marital status: Married    Spouse name: Not on file   Number of children: Not on file   Years of education: Not on file   Highest education level: Master's degree (e.g., MA, MS, MEng, MEd, MSW, MBA)  Occupational History   Not on file  Tobacco Use   Smoking status: Former    Types: Cigars   Smokeless tobacco: Never  Vaping Use   Vaping status: Never Used  Substance and Sexual Activity   Alcohol use: Not Currently    Comment: socially   Drug use: No   Sexual activity: Yes    Birth control/protection: None  Other Topics Concern   Not on file  Social History Narrative   Not on file   Social Drivers of Health   Financial Resource Strain: Low Risk  (02/19/2023)   Overall Financial Resource Strain (CARDIA)    Difficulty of Paying Living Expenses: Not hard at all  Food Insecurity: No Food  Insecurity (02/19/2023)   Hunger Vital Sign    Worried About Running Out of Food in the Last Year: Never true    Ran Out of Food in the Last Year: Never true  Transportation Needs: No Transportation Needs (02/19/2023)   PRAPARE - Administrator, Civil Service (Medical): No    Lack of Transportation (Non-Medical): No  Physical Activity: Insufficiently Active (02/19/2023)   Exercise Vital Sign    Days of Exercise per Week: 4 days    Minutes of Exercise per Session: 30 min  Stress: No Stress Concern Present (02/19/2023)   [employer] of Occupational Health - Occupational Stress Questionnaire    Feeling of Stress : Not at all  Social Connections: Moderately Integrated (02/19/2023)   Social Connection and Isolation Panel [NHANES]    Frequency of Communication with Friends and Family: Three times a week    Frequency of Social  Gatherings with Friends and Family: Once a week    Attends Religious Services: 1 to 4 times per year    Active Member of [employer] or Organizations: No    Attends Banker Meetings: Not on file    Marital Status: Married  Intimate Partner Violence: Not on file      02/19/2023   12:05 PM  Alcohol Use Disorder Test (AUDIT)  1. How often do you have a drink containing alcohol? 1  2. How many drinks containing alcohol do you have on a typical day when you are drinking? 0  3. How often do you have six or more drinks on one occasion? 0  AUDIT-C Score 1      Patient-reported   Family History  Problem Relation Age of Onset   Cancer Mother    Hyperlipidemia Mother    Diabetes Maternal Grandmother    Hypertension Maternal Grandfather    Hyperlipidemia Maternal Grandfather    Diabetes Maternal Grandfather    Allergies  Allergen Reactions   Egg-Derived Products    Other Other (See Comments)    Various pollen and trees.   Social History   Substance and Sexual Activity  Sexual Activity Yes   Birth control/protection: None   Social History   Tobacco Use   Smoking status: Former    Types: Cigars   Smokeless tobacco: Never  Vaping Use   Vaping status: Never Used  Substance Use Topics   Alcohol use: Not Currently    Comment: socially   Drug use: No      09/19/2023    9:34 AM  Depression screen PHQ 2/9  Decreased Interest 0  Down, Depressed, Hopeless 0  PHQ - 2 Score 0    Objective  BP 121/76   Pulse 70   Temp 98.8 F (37.1 C) (Temporal)   Ht 5' 7.25" (1.708 m)   Wt 172 lb 3.2 oz (78.1 kg)   SpO2 97%   BMI 26.77 kg/m  BP Readings from Last 3 Encounters:  09/19/23 121/76  04/09/23 115/77  03/13/23 135/85   Wt Readings from Last 10 Encounters:  09/19/23 172 lb 3.2 oz (78.1 kg)  04/09/23 158 lb 12.8 oz (72 kg)  02/20/23 159 lb 9.6 oz (72.4 kg)  02/05/23 161 lb (73 kg)  02/13/22 164 lb 14.5 oz (74.8 kg)  04/23/21 165 lb (74.8 kg)  02/09/21 165 lb (74.8  kg)  07/10/20 158 lb (71.7 kg)    Physical Exam Physical Exam HEENT: GENITOURINARY: No hernia detected upon examination. SKIN: No lesions or abnormalities detected upon inspection.  GEN: No acute distress, resting comfortably. HEENT: Tympanic membranes normal appearing bilaterally, oropharynx clear, no thyromegaly noted, no palpable lymphadenopathy or thyroid nodules.Cavity in oral cavity. CARDIOVASCULAR: S1 and S2 heart sounds with regular rate and rhythm, no murmurs appreciated. PULMONARY: Normal work of breathing, clear to auscultation bilaterally, no crackles, wheezes, or rhonchi. ABDOMEN: Soft, nontender, nondistended. MSK: No edema, cyanosis, or clubbing noted. SKIN: Warm, dry, no lesions of concern observed. NEUROLOGICAL: Cranial nerves II-XII grossly intact, strength 5/5 in upper and lower extremities, reflexes symmetric and intact bilaterally. PSYCH: Normal affect and thought content, pleasant and cooperative.   Results Reviewed: Last depression screening scores    09/19/2023    9:34 AM 04/09/2023    1:58 PM 02/05/2023    9:10 AM  PHQ 2/9 Scores  PHQ - 2 Score 0 0 0   Last fall risk screening    09/19/2023    9:34 AM  Fall Risk   Falls in the past year? 0  Number falls in past yr: 0  Injury with Fall? 0  Risk for fall due to : No Fall Risks  Follow up Falls evaluation completed   Last CBC Lab Results  Component Value Date   WBC 5.6 09/19/2023   HGB 15.8 09/19/2023   HCT 46.2 09/19/2023   MCV 90.9 09/19/2023   MCH 31.4 04/29/2022   RDW 13.2 09/19/2023   PLT 228.0 09/19/2023   Last metabolic panel Lab Results  Component Value Date   GLUCOSE 87 09/19/2023   NA 138 09/19/2023   K 4.2 09/19/2023   CL 101 09/19/2023   CO2 27 09/19/2023   BUN 13 09/19/2023   CREATININE 1.13 09/19/2023   GFR 77.01 09/19/2023   CALCIUM 10.2 09/19/2023   PROT 7.9 09/19/2023   ALBUMIN 5.0 09/19/2023   BILITOT 0.6 09/19/2023   ALKPHOS 43 09/19/2023   AST 21 09/19/2023    ALT 25 09/19/2023   ANIONGAP 8 04/29/2022   Last lipids Lab Results  Component Value Date   CHOL 268 (H) 09/19/2023   HDL 38.20 (L) 09/19/2023   LDLCALC 197 (H) 09/19/2023   TRIG 163.0 (H) 09/19/2023   CHOLHDL 7 09/19/2023   Last hemoglobin A1c Lab Results  Component Value Date   HGBA1C 5.7 09/19/2023   Last thyroid functions Lab Results  Component Value Date   TSH 2.230 09/19/2023   Last vitamin D Lab Results  Component Value Date   VD25OH 62.36 09/19/2023   Notes:  This document was synthesized by artificial intelligence (Abridge) using HIPAA-compliant recording of the clinical interaction;   We discussed the use of AI scribe software for clinical note transcription with the patient, who gave verbal consent to proceed.    This encounter employed state-of-the-art, real-time, collaborative documentation. The patient was empowered to actively review and assist in updating their electronic medical record on a shared monitor, ensuring transparency and improving accuracy.    Prior to and at the beginning of Comprehensive Physical Exam (CPE) preventive care annual visit appointment types  we clarify to patients "Our goal today is to focus on your preventive or annual Comprehensive Physical Exam (CPE) preventive care annual visit, which typically covers routine screenings and overall health maintenance. However, if you share any new or concerning symptoms--such as dizziness, passing out, severe pain, or anything else that may point to a more serious issue--we are both legally and ethically required to evaluate it. We cannot simply overlook or ignore such concerns, even if you later decide you don't want to  discuss them, because it could jeopardize your health.  If addressing a new concern takes [place] beyond the scope of the preventive visit, we may need to bill separately for that portion of care. We understand financial considerations are important, and we're happy to discuss your options if  something new comes up. However, we want to be clear that once you mention a potentially serious issue, we must investigate it; we can't ethically or legally exclude that from our records or our evaluation. Please let us know all of your questions or worries. Together, we can decide how best to manage them and how to minimize any unexpected costs, but we want to keep you safe above all else."   This disclosure is mandated by professional ethics and legal obligations, as healthcare providers must address any substantial health concerns raised during any patient interaction and a comprehensive ROS is required by insurance companies for billing preventive-care visit type.    Signed: Lula Olszewski, MD  [facility] [address] [place], [place] [address] Office:  [phone]

## 2023-09-20 LAB — TSH RFX ON ABNORMAL TO FREE T4: TSH: 2.23 u[IU]/mL (ref 0.450–4.500)

## 2023-09-20 NOTE — Assessment & Plan Note (Signed)
 Cholesterol levels are currently good, but there is a risk of cardiovascular events due to high cholesterol. Discussed the importance of maintaining a healthy diet and lifestyle to manage cholesterol levels and prevent heart disease. Emphasized that while dietary changes can help, medication may be necessary if levels do not improve, to prevent heart attacks and other cardiovascular events. - Order CT coronary calcium score to assess for coronary artery disease - Discuss potential need for medication if cholesterol levels are not improved with lifestyle changes

## 2023-09-20 NOTE — Assessment & Plan Note (Signed)
 Will order lab testing to guide management (PSA)

## 2023-09-20 NOTE — Assessment & Plan Note (Signed)
 Previously had elevated hemoglobin levels, which may be related to testosterone replacement therapy. The therapy has been reduced, and the hemoglobin levels have decreased to a healthier range. Plan to recheck the hemoglobin levels to ensure they remain stable. - Recheck hemoglobin levels

## 2023-09-20 NOTE — Patient Instructions (Signed)
 VISIT SUMMARY:  During your annual physical exam, we discussed your overall health and focused on several key areas including your cholesterol levels, body composition, allergies, and previous issues with high hemoglobin levels. We also touched on your dental health.  YOUR PLAN:  -ANNUAL PHYSICAL EXAMINATION: Your routine physical examination showed no significant findings. We emphasized the importance of preventive health measures and proactive health management to prevent future complications. We will perform a general health panel, metabolic panel, thyroid function test, and cholesterol check.  -HIGH CHOLESTEROL: Your cholesterol levels are currently good, but there is a risk of heart-related issues due to high cholesterol. We discussed the importance of maintaining a healthy diet and lifestyle to manage cholesterol levels and prevent heart disease. If your cholesterol levels do not improve with lifestyle changes, we may need to consider medication.  -OVERWEIGHT: Your BMI is high, which labels you as overweight for insurance purposes. This can help [place] screen for conditions like diabetes. Despite your high BMI, your overall health is not concerning due to your healthy body composition and significant muscle mass. We will order a diabetes screening as part of your general health panel.  -ALLERGIC CONJUNCTIVITIS: You reported itchy eyes, which may be related to allergies. We suggested trying Pataday, which may be more effective than your current over-the-counter medication. If your symptoms persist, we may need to refer you to an allergist.  -HIGH HEMOGLOBIN LEVELS: You previously had high hemoglobin levels, which may be related to testosterone replacement therapy. The therapy has been reduced, and your hemoglobin levels have decreased to a healthier range. We plan to recheck your hemoglobin levels to ensure they remain stable.  -DENTAL CAVITY: You have a cavity that needs to be addressed by the  dentist. You have a dental appointment scheduled for March, and it's important that you keep this appointment.  INSTRUCTIONS:  We will discuss the results of your general health panel, metabolic panel, thyroid function test, and cholesterol check within a week. In the meantime, continue with your healthy diet and lifestyle changes. Try using Pataday for your itchy eyes and consider removing plants from your house to reduce allergy symptoms. Keep your dental appointment in March.  Building Your Long-Term Health Plan  During today's preventive visit, we covered a variety of important health checks to help you stay on top of your well-being.  We also discussed strategies to maintain your health and identified some areas that might benefit from further exploration.   Preventive care visits like today's are designed to be proactive, but sometimes additional attention may be needed.  Rest assured, we're here for you.  If these areas require further evaluation or management, we'd be happy to schedule a separate, focused appointment to address them in detail.  Addressing Next Steps  [x]   Follow-up Visit: To ensure we address any unresolved issues and continue monitoring your overall health, we recommend scheduling a follow-up appointment in 1 year for your next preventive care visit. If you experience any new problems, need to discuss any medical concerns, or your condition worsens before then, please don't hesitate to call our office to schedule an appointment or seek emergency care as needed.  [x]   Preventive Measures: Maintaining healthy habits plays a crucial role in overall wellness. We recommend considering these tips: [x]   Regular appointments with dental and vision professionals [x]   Nightly nasal saline mist to keep sinuses clear [x]   Consistent toothbrushing to maintain oral health [x]   Using an app like SnoreLab to track sleep quality [  x]  Routine checks of blood pressure and heart rate [x]    Medical Information: In some instances, we may require additional medical information from other providers to create a comprehensive picture of your health. If applicable, we can provide a medical information release form at the front desk for you to sign, allowing [place] to gather these records. [x]   Lab Tests: If any lab tests were ordered today, scheduling them within a week of your visit helps ensure the best possible insurance coverage.  Planning Follow Up to Work on a Problem? Make the Most of Our Focused (20 minute) Appointments  [x]   Clearly state your top concerns at the beginning of the visit to focus our discussion [x]   If you anticipate you will need more time, please inform the front desk during scheduling - we can book multiple appointments in the same week. [x]   If you have transportation problems- use our convenient video appointments or ask about transportation support. [x]   We can get down to business faster if you use MyChart to update information before the visit and submit non-urgent questions before your visit. Thank you for taking the time to provide details through MyChart.  Let our nurse know and she can import this information into your encounter documents.  Arrival and Wait Times  [x]   Arriving on time ensures that everyone receives prompt attention. [x]   Early morning (8a) and afternoon (1p) appointments tend to have shortest wait times. [x]   Unfortunately, we cannot delay appointments for late arrivals or hold slots during phone calls.  Bring to Your Next Appointment:  [x]   Medications: Please bring all your medication bottles to your next appointment to ensure we have an accurate record of your prescriptions. [x]   Health Diaries: If you're monitoring any health conditions at home, keeping a diary of your readings can be very helpful for discussions at your next appointment.  Reviewing Your Records  [x]   Review your attached preventive care information at the end of  these patient instructions. [x]   Review this early draft of your clinical encounter notes below and the final encounter summary tomorrow on MyChart after its been completed.      Getting Answers and Following Up  [x]   Simple Questions & Concerns: For quick questions or basic follow-up after your visit, reach [place] at [phone] or MyChart messaging. [x]   Complex Concerns: If your concern is more complex, scheduling an appointment might be best. Discuss this with the staff to find the most suitable option. [x]   Lab & Imaging Results: We'll contact you directly if results are abnormal or you don't use MyChart. Most normal results will be on MyChart within 2-3 business days, with a review message from Dr. Jon Billings. Haven't heard back in 2 weeks? Need results sooner? Contact us at [phone]. [x]   Referrals: Our referral coordinator will manage specialist referrals. The specialist's office should contact you within 2 weeks to schedule an appointment. Call us if you haven't heard from them after 2 weeks.  Staying Connected  [x]   MyChart: Activate your MyChart for the fastest way to access results and message [place]. See the last page of this paperwork for instructions on how to activate.  Billing  [x]   X-ray & Lab Orders: These are billed by separate companies. Contact the invoicing company directly for questions or concerns. [x]   Visit Charges: Discuss any billing inquiries with our administrative services team.  Your Satisfaction Matters  [x]   Share Your Experience: We strive for your satisfaction! If  you have any complaints, or preferably compliments, please let Dr. Jon Billings know directly or contact our Practice Administrators, Edwena Felty or [employer], by asking at the front desk.                 Next Steps  [x]   Schedule Follow-Up:  We recommend a follow-up appointment in 1 year for your next wellness visit.  If you develop any new problems, want to address any medical  issues, or your condition worsens before then, please call us for an appointment or seek emergency care. [x]   Preventive Care:  Make sure to keep regular appointments with dental and vision professionals, use nightly nasal saline mist sprays to keep your sinuses clear and toothbrushing to protect your teeth. Use SnoreLab App or other app to track your sleep quality. Check blood pressure and heart rate routinely. [x]   Medical Information Release:  For any relevant medical information we don't have, please sign a release form at the front desk so we can obtain it for your records. [x]   Lab Tests:  Schedule any lab tests from today for within a week to ensure best insurance coverage.    Making the Most of Our Focused (20 minute) Appointments:  [x]   Clearly state your top concerns at the beginning of the visit to focus our discussion [x]   If you anticipate you will need more time, please inform the front desk during scheduling - we can book multiple appointments in the same week. [x]   If you have transportation problems- use our convenient video appointments or ask about transportation support. [x]   We can get down to business faster if you use MyChart to update information before the visit and submit non-urgent questions before your visit. Thank you for taking the time to provide details through MyChart.  Let our nurse know and she can import this information into your encounter documents.  Arrival and Wait Times: [x]   Arriving on time ensures that everyone receives prompt attention. [x]   Early morning (8a) and afternoon (1p) appointments tend to have shortest wait times. [x]   Unfortunately, we cannot delay appointments for late arrivals or hold slots during phone calls.  Bring to Your Next Appointment  [x]   Medications: Please bring all your medication bottles to your next appointment to ensure we have an accurate record of your prescriptions. [x]   Health Diaries: If you're monitoring any health  conditions at home, keeping a diary of your readings can be very helpful for discussions at your next appointment.  Reviewing Your Records  [x]   Review your attached preventive care information at the end of these patient instructions. [x]   Review this early draft of your clinical encounter notes below and the final encounter summary tomorrow on MyChart after its been completed.   Encounter for annual health examination -     Lipid panel -     Comprehensive metabolic panel -     CBC with Differential/Platelet -     TSH Rfx on Abnormal to Free T4 -     PSA -     Hemoglobin A1c -     VITAMIN D 25 Hydroxy (Vit-D Deficiency, Fractures) -     CT CARDIAC SCORING (SELF PAY ONLY); Future  Mixed hyperlipidemia -     Lipid panel -     CT CARDIAC SCORING (SELF PAY ONLY); Future  Incomplete emptying of bladder  Hypogonadism in male  Overweight -     Hemoglobin A1c  Vitamin D deficiency -  VITAMIN D 25 Hydroxy (Vit-D Deficiency, Fractures)  Allergic conjunctivitis of both eyes  Hyperglycemia  Prostate cancer screening encounter, options and risks discussed -     PSA     Getting Answers and Following Up  [x]   Simple Questions & Concerns: For quick questions or basic follow-up after your visit, reach [place] at [phone] or MyChart messaging. [x]   Complex Concerns: If your concern is more complex, scheduling an appointment might be best. Discuss this with the staff to find the most suitable option. [x]   Lab & Imaging Results: We'll contact you directly if results are abnormal or you don't use MyChart. Most normal results will be on MyChart within 2-3 business days, with a review message from Dr. Jon Billings. Haven't heard back in 2 weeks? Need results sooner? Contact us at [phone]. [x]   Referrals: Our referral coordinator will manage specialist referrals. The specialist's office should contact you within 2 weeks to schedule an appointment. Call us if you haven't heard from them  after 2 weeks.  Staying Connected  [x]   MyChart: Activate your MyChart for the fastest way to access results and message [place]. See the last page of this paperwork for instructions on how to activate.  Billing  [x]   X-ray & Lab Orders: These are billed by separate companies. Contact the invoicing company directly for questions or concerns. [x]   Visit Charges: Discuss any billing inquiries with our administrative services team.  Your Satisfaction Matters  [x]   Share Your Experience: We strive for your satisfaction! If you have any complaints, or preferably compliments, please let Dr. Jon Billings know directly or contact our Practice Administrators, Edwena Felty or [employer], by asking at the front desk.

## 2023-09-20 NOTE — Assessment & Plan Note (Signed)
 BMI is high, labeling the patient as overweight for insurance purposes, which can facilitate screening for diabetes and other conditions. Despite high BMI, the patient has a healthy body composition with significant muscle mass. Discussed that while BMI is high, the patient's overall health is not concerning. - Order diabetes screening as part of the general health panel BMI Readings from Last 3 Encounters:  09/19/23 26.77 kg/m  04/09/23 24.69 kg/m  02/20/23 25.00 kg/m

## 2023-09-21 DIAGNOSIS — F1020 Alcohol dependence, uncomplicated: Secondary | ICD-10-CM | POA: Diagnosis not present

## 2023-09-24 DIAGNOSIS — F1020 Alcohol dependence, uncomplicated: Secondary | ICD-10-CM | POA: Diagnosis not present

## 2023-09-25 DIAGNOSIS — F1020 Alcohol dependence, uncomplicated: Secondary | ICD-10-CM | POA: Diagnosis not present

## 2023-09-26 DIAGNOSIS — F1020 Alcohol dependence, uncomplicated: Secondary | ICD-10-CM | POA: Diagnosis not present

## 2023-09-28 DIAGNOSIS — F1020 Alcohol dependence, uncomplicated: Secondary | ICD-10-CM | POA: Diagnosis not present

## 2023-10-01 DIAGNOSIS — F1020 Alcohol dependence, uncomplicated: Secondary | ICD-10-CM | POA: Diagnosis not present

## 2023-10-02 DIAGNOSIS — F1020 Alcohol dependence, uncomplicated: Secondary | ICD-10-CM | POA: Diagnosis not present

## 2023-10-03 DIAGNOSIS — F1020 Alcohol dependence, uncomplicated: Secondary | ICD-10-CM | POA: Diagnosis not present

## 2023-10-05 DIAGNOSIS — F1020 Alcohol dependence, uncomplicated: Secondary | ICD-10-CM | POA: Diagnosis not present

## 2023-10-08 DIAGNOSIS — F1020 Alcohol dependence, uncomplicated: Secondary | ICD-10-CM | POA: Diagnosis not present

## 2023-10-09 DIAGNOSIS — F1020 Alcohol dependence, uncomplicated: Secondary | ICD-10-CM | POA: Diagnosis not present

## 2023-10-10 DIAGNOSIS — F1020 Alcohol dependence, uncomplicated: Secondary | ICD-10-CM | POA: Diagnosis not present

## 2023-10-12 DIAGNOSIS — F1020 Alcohol dependence, uncomplicated: Secondary | ICD-10-CM | POA: Diagnosis not present

## 2023-10-15 DIAGNOSIS — F1020 Alcohol dependence, uncomplicated: Secondary | ICD-10-CM | POA: Diagnosis not present

## 2023-10-16 DIAGNOSIS — F1020 Alcohol dependence, uncomplicated: Secondary | ICD-10-CM | POA: Diagnosis not present

## 2023-10-17 DIAGNOSIS — F1020 Alcohol dependence, uncomplicated: Secondary | ICD-10-CM | POA: Diagnosis not present

## 2023-10-19 DIAGNOSIS — F1020 Alcohol dependence, uncomplicated: Secondary | ICD-10-CM | POA: Diagnosis not present

## 2023-10-22 DIAGNOSIS — F1020 Alcohol dependence, uncomplicated: Secondary | ICD-10-CM | POA: Diagnosis not present

## 2023-10-23 ENCOUNTER — Ambulatory Visit (HOSPITAL_BASED_OUTPATIENT_CLINIC_OR_DEPARTMENT_OTHER)
Admission: RE | Admit: 2023-10-23 | Discharge: 2023-10-23 | Disposition: A | Discharge status: Home / Self Care | Payer: BC Managed Care – PPO | Source: Ambulatory Visit | Attending: Internal Medicine | Admitting: Internal Medicine

## 2023-10-23 DIAGNOSIS — Z0000 Encounter for general adult medical examination without abnormal findings: Secondary | ICD-10-CM | POA: Insufficient documentation

## 2023-10-23 DIAGNOSIS — E782 Mixed hyperlipidemia: Secondary | ICD-10-CM | POA: Insufficient documentation

## 2023-10-25 ENCOUNTER — Encounter: Payer: Self-pay | Admitting: Internal Medicine

## 2023-10-25 DIAGNOSIS — R7303 Prediabetes: Secondary | ICD-10-CM | POA: Insufficient documentation

## 2023-10-25 DIAGNOSIS — I2510 Atherosclerotic heart disease of native coronary artery without angina pectoris: Secondary | ICD-10-CM | POA: Insufficient documentation

## 2023-10-25 NOTE — Telephone Encounter (Signed)
 read by Johnnette Litter at 12:47PM on 10/25/2023

## 2023-10-25 NOTE — Progress Notes (Signed)
 Reviewed coronary calcium score and labs from 09/19/2023. Key findings: Calcium score 0.61 (76th percentile) shows early coronary artery disease; LDL cholesterol significantly elevated at 197 mg/dL; GLO7F borderline at 6.4%. Clinical interpretation: These results indicate need for medical therapy per current guidelines, despite borderline calculated risk score. Next steps: Recommend statin therapy initiation; appointment needed within 1 month to discuss medication options; focus on Mediterranean diet and regular exercise. See Patient Message for details.

## 2023-11-01 ENCOUNTER — Other Ambulatory Visit: Payer: Self-pay

## 2023-11-01 ENCOUNTER — Encounter (HOSPITAL_BASED_OUTPATIENT_CLINIC_OR_DEPARTMENT_OTHER): Payer: Self-pay

## 2023-11-01 ENCOUNTER — Emergency Department (HOSPITAL_BASED_OUTPATIENT_CLINIC_OR_DEPARTMENT_OTHER)
Admission: EM | Admit: 2023-11-01 | Discharge: 2023-11-01 | Disposition: A | Discharge status: Home / Self Care | Attending: Emergency Medicine | Admitting: Emergency Medicine

## 2023-11-01 DIAGNOSIS — R1013 Epigastric pain: Secondary | ICD-10-CM | POA: Diagnosis not present

## 2023-11-01 DIAGNOSIS — R110 Nausea: Secondary | ICD-10-CM | POA: Diagnosis not present

## 2023-11-01 DIAGNOSIS — R059 Cough, unspecified: Secondary | ICD-10-CM | POA: Diagnosis not present

## 2023-11-01 MED ORDER — LIDOCAINE VISCOUS HCL 2 % MT SOLN
15.0000 mL | Freq: Once | OROMUCOSAL | Status: AC
  Administered 2023-11-01: 15 mL via ORAL
  Filled 2023-11-01: qty 15

## 2023-11-01 MED ORDER — ONDANSETRON 4 MG PO TBDP
4.0000 mg | ORAL_TABLET | Freq: Once | ORAL | Status: AC
  Administered 2023-11-01: 4 mg via ORAL
  Filled 2023-11-01: qty 1

## 2023-11-01 MED ORDER — ALUM & MAG HYDROXIDE-SIMETH 200-200-20 MG/5ML PO SUSP
30.0000 mL | Freq: Once | ORAL | Status: AC
  Administered 2023-11-01: 30 mL via ORAL
  Filled 2023-11-01: qty 30

## 2023-11-01 MED ORDER — ONDANSETRON 4 MG PO TBDP: ORAL_TABLET | ORAL | 0 refills | Status: DC

## 2023-11-01 NOTE — Discharge Instructions (Addendum)
Try pepcid or tagamet up to twice a day.  Try to avoid things that may make this worse, most commonly these are spicy foods tomato based products fatty foods chocolate and peppermint.  Alcohol and tobacco can also make this worse.  Return to the emergency department for sudden worsening pain fever or inability to eat or drink.  

## 2023-11-01 NOTE — ED Provider Notes (Signed)
 [place] [facility]PART[facility] AT [facility] Provider Note   CSN: [id number] Arrival date & time: 11/01/23  0354     History  Chief Complaint  Patient presents with   Cough    Mario Cain is a 48 y.o. male.  48 yo M with a chief complaint of burning to the back of the throat and in the chest and nausea.  He said he ate meal of seafood and had some alcohol and then when he went to sleep woke up suddenly feeling not well.  He told the nurse that he was coughing but this is not really coughing he just feels like a burning in the back of his throat which makes him feel like he needs to cough.   Cough      Home Medications Prior to Admission medications   Medication Sig Start Date End Date Taking? Authorizing Provider  ondansetron (ZOFRAN-ODT) 4 MG disintegrating tablet 4mg  ODT q4 hours prn nausea/vomit 11/01/23  Yes Melene Plan, DO  CLOMID 50 MG tablet Take by mouth. 02/12/23   [provider]  COD LIVER OIL PO Take 1,000 mg by mouth daily.    [provider]  cyclobenzaprine (FLEXERIL) 10 MG tablet Take 1 tablet (10 mg total) by mouth 3 (three) times daily as needed for muscle spasms. 04/09/23   Lula Olszewski, MD  gabapentin (NEURONTIN) 300 MG capsule Take 1 capsule (300 mg total) by mouth 3 (three) times daily. 04/09/23   Lula Olszewski, MD  ibuprofen (ADVIL) 800 MG tablet Take 1 tablet (800 mg total) by mouth every 8 (eight) hours as needed. Take with food to prevent GI upset 03/13/23   Lamptey, Britta Mccreedy, MD  ketorolac (TORADOL) 10 MG tablet Take 1 tablet (10 mg total) by mouth every 6 (six) hours as needed. 04/09/23   Lula Olszewski, MD  Multiple Vitamin (MULTIVITAMIN PO) Take 2 each by mouth daily. Gummies.    [provider]  tiZANidine (ZANAFLEX) 4 MG tablet Take 1 tablet (4 mg total) by mouth every 8 (eight) hours as needed for muscle spasms. Do not take with alcohol or while driving or operating heavy machinery.  May cause drowsiness.  03/13/23   LampteyBritta Mccreedy, MD      Allergies    Egg-derived products and Other    Review of Systems   Review of Systems  Respiratory:  Positive for cough.     Physical Exam Updated Vital Signs BP (!) 143/95 (BP Location: Right Arm)   Pulse (!) 106   Temp 98.3 F (36.8 C)   Resp 16   Ht 5\' 7"  (1.702 m)   Wt 73.9 kg   SpO2 99%   BMI 25.53 kg/m  Physical Exam Vitals and nursing note reviewed.  Constitutional:      Appearance: He is well-developed.     Comments: Slurred speech  HENT:     Head: Normocephalic and atraumatic.  Eyes:     Pupils: Pupils are equal, round, and reactive to light.  Neck:     Vascular: No JVD.  Cardiovascular:     Rate and Rhythm: Normal rate and regular rhythm.     Heart sounds: No murmur heard.    No friction rub. No gallop.  Pulmonary:     Effort: No respiratory distress.     Breath sounds: No wheezing.  Abdominal:     General: There is no distension.     Tenderness: There is no abdominal tenderness. There is no  guarding or rebound.  Musculoskeletal:        General: Normal range of motion.     Cervical back: Normal range of motion and neck supple.  Skin:    Coloration: Skin is not pale.     Findings: No rash.  Neurological:     Mental Status: He is alert and oriented to person, place, and time.  Psychiatric:        Behavior: Behavior normal.     ED Results / Procedures / Treatments   Labs (all labs ordered are listed, but only abnormal results are displayed) Labs Reviewed - No data to display  EKG None  Radiology No results found.  Procedures Procedures    Medications Ordered in ED Medications  alum & mag hydroxide-simeth (MAALOX/MYLANTA) 200-200-20 MG/5ML suspension 30 mL (30 mLs Oral Given 11/01/23 0501)    And  lidocaine (XYLOCAINE) 2 % viscous mouth solution 15 mL (15 mLs Oral Given 11/01/23 0502)  ondansetron (ZOFRAN-ODT) disintegrating tablet 4 mg (4 mg Oral Given 11/01/23 0501)    ED Course/ Medical Decision  Making/ A&P                                 Medical Decision Making Risk OTC drugs. Prescription drug management.   48 yo M with a chief complaints of epigastric discomfort.  By history the patient has indigestion.  He had what sounds of a large meal and prior to going to bed.  He is clinically intoxicated on exam.  Will attempt to control symptoms.  Patient is feeling much better on repeat assessment.  Would like to try and go home.  PCP follow-up.  5:26 AM:  I have discussed the diagnosis/risks/treatment options with the patient and family.  Evaluation and diagnostic testing in the emergency department does not suggest an emergent condition requiring admission or immediate intervention beyond what has been performed at this time.  They will follow up with PCP. We also discussed returning to the ED immediately if new or worsening sx occur. We discussed the sx which are most concerning (e.g., sudden worsening pain, fever, inability to tolerate by mouth) that necessitate immediate return. Medications administered to the patient during their visit and any new prescriptions provided to the patient are listed below.  Medications given during this visit Medications  alum & mag hydroxide-simeth (MAALOX/MYLANTA) 200-200-20 MG/5ML suspension 30 mL (30 mLs Oral Given 11/01/23 0501)    And  lidocaine (XYLOCAINE) 2 % viscous mouth solution 15 mL (15 mLs Oral Given 11/01/23 0502)  ondansetron (ZOFRAN-ODT) disintegrating tablet 4 mg (4 mg Oral Given 11/01/23 0501)     The patient appears reasonably screen and/or stabilized for discharge and I doubt any other medical condition or other [facility] requiring further screening, evaluation, or treatment in the ED at this time prior to discharge.          Final Clinical Impression(s) / ED Diagnoses Final diagnoses:  Epigastric pain    Rx / DC Orders ED Discharge Orders          Ordered    ondansetron (ZOFRAN-ODT) 4 MG disintegrating tablet         11/01/23 0525              Melene Plan, DO 11/01/23 [phone]

## 2023-11-01 NOTE — ED Triage Notes (Addendum)
 Pt states he was awakened from sleep with a burning pain in his throat and constantly coughing up mucus. States he has not been sick

## 2023-11-05 DIAGNOSIS — E291 Testicular hypofunction: Secondary | ICD-10-CM | POA: Diagnosis not present

## 2023-11-05 DIAGNOSIS — Z79890 Hormone replacement therapy (postmenopausal): Secondary | ICD-10-CM | POA: Diagnosis not present

## 2023-11-07 DIAGNOSIS — R6882 Decreased libido: Secondary | ICD-10-CM | POA: Diagnosis not present

## 2023-11-07 DIAGNOSIS — E291 Testicular hypofunction: Secondary | ICD-10-CM | POA: Diagnosis not present

## 2023-11-07 DIAGNOSIS — Z6825 Body mass index (BMI) 25.0-25.9, adult: Secondary | ICD-10-CM | POA: Diagnosis not present

## 2023-11-07 DIAGNOSIS — G479 Sleep disorder, unspecified: Secondary | ICD-10-CM | POA: Diagnosis not present

## 2023-11-27 DIAGNOSIS — F1020 Alcohol dependence, uncomplicated: Secondary | ICD-10-CM | POA: Diagnosis not present

## 2023-12-11 DIAGNOSIS — F1020 Alcohol dependence, uncomplicated: Secondary | ICD-10-CM | POA: Diagnosis not present

## 2023-12-25 DIAGNOSIS — F1020 Alcohol dependence, uncomplicated: Secondary | ICD-10-CM | POA: Diagnosis not present

## 2024-01-08 DIAGNOSIS — F1020 Alcohol dependence, uncomplicated: Secondary | ICD-10-CM | POA: Diagnosis not present

## 2024-01-10 ENCOUNTER — Encounter: Payer: Self-pay | Admitting: Internal Medicine

## 2024-01-10 ENCOUNTER — Ambulatory Visit (INDEPENDENT_AMBULATORY_CARE_PROVIDER_SITE_OTHER): Admitting: Internal Medicine

## 2024-01-10 VITALS — BP 110/70 | HR 72 | Temp 98.0°F | Ht 67.0 in | Wt 167.8 lb

## 2024-01-10 DIAGNOSIS — M1990 Unspecified osteoarthritis, unspecified site: Secondary | ICD-10-CM | POA: Diagnosis not present

## 2024-01-10 DIAGNOSIS — E782 Mixed hyperlipidemia: Secondary | ICD-10-CM

## 2024-01-10 DIAGNOSIS — R7303 Prediabetes: Secondary | ICD-10-CM | POA: Diagnosis not present

## 2024-01-10 DIAGNOSIS — I2510 Atherosclerotic heart disease of native coronary artery without angina pectoris: Secondary | ICD-10-CM | POA: Diagnosis not present

## 2024-01-10 DIAGNOSIS — I2584 Coronary atherosclerosis due to calcified coronary lesion: Secondary | ICD-10-CM

## 2024-01-10 MED ORDER — ROSUVASTATIN CALCIUM 40 MG PO TABS: 40.0000 mg | ORAL_TABLET | Freq: Every day | ORAL | 3 refills | Status: AC

## 2024-01-10 MED ORDER — IBUPROFEN 800 MG PO TABS: 800.0000 mg | ORAL_TABLET | Freq: Three times a day (TID) | ORAL | 0 refills | Status: AC | PRN

## 2024-01-10 NOTE — Assessment & Plan Note (Signed)
 He has mild prediabetes, an additional risk factor for cardiovascular disease, noted in the context of overall cardiovascular risk management. Monitor blood glucose levels and provide guidance on lifestyle modifications to prevent progression to diabetes.

## 2024-01-10 NOTE — Assessment & Plan Note (Signed)
 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 Aggressive cholesterol management is crucial to prevent the progression of coronary artery disease and reduce the risk of cardiovascular events. Initiate high-intensity statin therapy to lower LDL cholesterol. Consider adding PCSK9 inhibitors if LDL cholesterol does not reach target levels. Advise on dietary and lifestyle changes to improve cholesterol levels.

## 2024-01-10 NOTE — Assessment & Plan Note (Signed)
 He has a small amount of coronary artery calcification in the left circumflex artery, with a CT coronary calcium score of 0.61, placing him in the 76th percentile for his age. This indicates early coronary artery disease, though subclinical and unlikely to cause a heart attack soon. Treating this early sign is recommended to prevent progression and future cardiovascular events. Start high-intensity statin therapy, such as rosuvastatin, to lower LDL cholesterol and potentially dissolve the calcification. Discuss daily aspirin therapy to prevent heart attacks, considering the low benefit against the low risk. Consider PCSK9 inhibitors if LDL levels do not decrease sufficiently with statins alone. Provide information on the risks and benefits of statins and PCSK9 inhibitors, noting the low risk of muscle cramps and liver issues with statins. Encourage dietary and lifestyle modifications to support cholesterol management.

## 2024-01-10 NOTE — Progress Notes (Signed)
 ==============================  [facility]: [phone]   -- Medical Office Visit --  Patient: Mario Cain      Age: 48 y.o.       Sex:  male  Date:   01/10/2024 Today's Healthcare Provider: Anthon Kins, MD  ==============================   Chief Complaint: Hyperlipidemia (Pt is not fasting)   Discussed the use of AI scribe software for clinical note transcription with the patient, who gave verbal consent to proceed.  History of Present Illness 48 year old male who presents for cholesterol management.  He has not yet started rosuvastatin, which was previously discussed as a treatment option. His LDL cholesterol is 197, and his HDL is 38, both of which are considered poor levels for cholesterol. He is not currently taking any medications. He has mild prediabetes and elevated triglycerides, which are additional risk factors for heart disease.  A CT coronary calcium score was performed, showing a score of 0.61, placing him in the 76th percentile for his age. This indicates a minuscule amount of coronary artery calcification, specifically in the left circumflex artery.  He had an ER visit on April 10th due to an incident while eating oysters, possibly involving a fish bone or oyster shell, but the issue has resolved. Medications: not yet discussed Lab Results  Component Value Date   HDL 38.20 (L) 09/19/2023   HDL 31.10 (L) 02/06/2023   CHOLHDL 7 09/19/2023   CHOLHDL 8 02/06/2023   Lab Results  Component Value Date   LDLCALC 197 (H) 09/19/2023   LDLCALC 173 (H) 02/06/2023   Lab Results  Component Value Date   TRIG 163.0 (H) 09/19/2023   TRIG 183.0 (H) 02/06/2023   Lab Results  Component Value Date   CHOL 268 (H) 09/19/2023   CHOL 240 (H) 02/06/2023   The 10-year ASCVD risk score (Arnett DK, et al., 2019) is: 4.4%   Values used to calculate the score:     Age: 76 years     Clincally relevant sex: Male     Is Non-Hispanic  African American: Yes     Diabetic: No     Tobacco smoker: No     Systolic Blood Pressure: 110 mmHg     Is BP treated: No     HDL Cholesterol: 38.2 mg/dL     Total Cholesterol: 268 mg/dL Lab Results  Component Value Date   ALT 25 09/19/2023   AST 21 09/19/2023   ALKPHOS 43 09/19/2023   TSH 2.230 09/19/2023   HGBA1C 5.7 09/19/2023   Body mass index is 26.28 kg/m.  Lipoprotein(a), Apolipoprotein B (ApoB), and High-sensitivity C-reactive protein (hs-CRP) No results found for: HSCRP, LIPOA Improving Your Cholesterol: Diet: Focus on a Mediterranean-style diet, limit saturated fats and sugars, and increase omega-3 fatty acids (fish, flaxseeds,nuts,extra virgin olive oil, avocados). Exercise: Engage in regular physical activity (aerobic exercises are particularly beneficial for HDL). Weight Management: Maintain a healthy weight. Smoking Cessation: Quitting smoking improves cholesterol levels.   Background Reviewed: Problem List: has Hypogonadism in male; Incomplete emptying of bladder; Fatigue; Abdominal swelling; Internal hemorrhoids; Hyperlipidemia; Overweight; Prediabetes; and Coronary artery disease on their problem list. Past Medical History:  has a past medical history of Allergy and High cholesterol. Past Surgical History:   has a past surgical history that includes arthritis. Social History:   reports that he has quit smoking. His smoking use included cigars. He has never used smokeless tobacco. He reports current alcohol  use. He reports that he does not  use drugs. Family History:  family history includes Cancer in his mother; Diabetes in his maternal grandfather and maternal grandmother; Hyperlipidemia in his maternal grandfather and mother; Hypertension in his maternal grandfather. Allergies:  is allergic to egg-derived products and other.   Medication Reconciliation: Current Outpatient Medications on File Prior to Visit  Medication Sig   CLOMID 50 MG tablet Take by mouth.    COD LIVER OIL PO Take 1,000 mg by mouth daily.   Multiple Vitamin (MULTIVITAMIN PO) Take 2 each by mouth daily. Gummies.   No current facility-administered medications on file prior to visit.   Medications Discontinued During This Encounter  Medication Reason   ibuprofen  (ADVIL ) 800 MG tablet Reorder   tiZANidine  (ZANAFLEX ) 4 MG tablet    ketorolac  (TORADOL ) 10 MG tablet    cyclobenzaprine  (FLEXERIL ) 10 MG tablet    gabapentin  (NEURONTIN ) 300 MG capsule    ondansetron  (ZOFRAN -ODT) 4 MG disintegrating tablet      Physical Exam:    01/10/2024   10:59 AM 11/01/2023    4:00 AM 11/01/2023    3:59 AM  Vitals with BMI  Height 5' 7  5' 7  Weight 167 lbs 13 oz  163 lbs  BMI 26.28  25.52  Systolic 110 143   Diastolic 70 95   Pulse 72 106   Vital signs reviewed.  Nursing notes reviewed. Weight trend reviewed. Physical Exam General Appearance:  No acute distress appreciable.   Well-groomed, healthy-appearing male.  Well proportioned with no abnormal fat distribution.  Good muscle tone. Pulmonary:  Normal work of breathing at rest, no respiratory distress apparent. SpO2: 98 %  Musculoskeletal: All extremities are intact.  Neurological:  Awake, alert, oriented, and engaged.  No obvious focal neurological deficits or cognitive impairments.  Sensorium seems unclouded.   Speech is clear and coherent with logical content. Psychiatric:  Appropriate mood, pleasant and cooperative demeanor, thoughtful and engaged during the exam  Results:    09/19/2023    9:34 AM 04/09/2023    1:58 PM 02/05/2023    9:10 AM  PHQ 2/9 Scores  PHQ - 2 Score 0 0 0   Results LABS LDL: 197 HDL: 38  RADIOLOGY CT coronary calcium score: 76th percentile for age, score 0.61, minimal coronary artery calcification in the left circumflex artery (11/01/2023)    No results found for any visits on 01/10/24. Office Visit on 09/19/2023  Component Date Value Ref Range Status   Cholesterol 09/19/2023 268 (H)  0 - 200  mg/dL Final   Triglycerides 82/95/6213 163.0 (H)  0.0 - 149.0 mg/dL Final   HDL 08/65/7846 38.20 (L)  >96.29 mg/dL Final   VLDL 52/84/1324 32.6  0.0 - 40.0 mg/dL Final   LDL Cholesterol 09/19/2023 197 (H)  0 - 99 mg/dL Final   Total CHOL/HDL Ratio 09/19/2023 7   Final   NonHDL 09/19/2023 229.92   Final   Sodium 09/19/2023 138  135 - 145 mEq/L Final   Potassium 09/19/2023 4.2  3.5 - 5.1 mEq/L Final   Chloride 09/19/2023 101  96 - 112 mEq/L Final   CO2 09/19/2023 27  19 - 32 mEq/L Final   Glucose, Bld 09/19/2023 87  70 - 99 mg/dL Final   BUN 40/04/2724 13  6 - 23 mg/dL Final   Creatinine, Ser 09/19/2023 1.13  0.40 - 1.50 mg/dL Final   Total Bilirubin 09/19/2023 0.6  0.2 - 1.2 mg/dL Final   Alkaline Phosphatase 09/19/2023 43  39 - 117 U/L Final   AST  09/19/2023 21  0 - 37 U/L Final   ALT 09/19/2023 25  0 - 53 U/L Final   Total Protein 09/19/2023 7.9  6.0 - 8.3 g/dL Final   Albumin 16/04/9603 5.0  3.5 - 5.2 g/dL Final   GFR 54/03/8118 77.01  >60.00 mL/min Final   Calcium 09/19/2023 10.2  8.4 - 10.5 mg/dL Final   WBC 14/78/2956 5.6  4.0 - 10.5 K/uL Final   RBC 09/19/2023 5.08  4.22 - 5.81 Mil/uL Final   Hemoglobin 09/19/2023 15.8  13.0 - 17.0 g/dL Final   HCT 21/30/8657 46.2  39.0 - 52.0 % Final   MCV 09/19/2023 90.9  78.0 - 100.0 fl Final   MCHC 09/19/2023 34.2  30.0 - 36.0 g/dL Final   RDW 84/69/6295 13.2  11.5 - 15.5 % Final   Platelets 09/19/2023 228.0  150.0 - 400.0 K/uL Final   Neutrophils Relative % 09/19/2023 38.2 (L)  43.0 - 77.0 % Final   Lymphocytes Relative 09/19/2023 47.2 (H)  12.0 - 46.0 % Final   Monocytes Relative 09/19/2023 9.4  3.0 - 12.0 % Final   Eosinophils Relative 09/19/2023 4.2  0.0 - 5.0 % Final   Basophils Relative 09/19/2023 1.0  0.0 - 3.0 % Final   Neutro Abs 09/19/2023 2.1  1.4 - 7.7 K/uL Final   Lymphs Abs 09/19/2023 2.6  0.7 - 4.0 K/uL Final   Monocytes Absolute 09/19/2023 0.5  0.1 - 1.0 K/uL Final   Eosinophils Absolute 09/19/2023 0.2  0.0 - 0.7 K/uL  Final   Basophils Absolute 09/19/2023 0.1  0.0 - 0.1 K/uL Final   TSH 09/19/2023 2.230  0.450 - 4.500 uIU/mL Final   PSA 09/19/2023 1.33  0.10 - 4.00 ng/mL Final   Hgb A1c MFr Bld 09/19/2023 5.7  4.6 - 6.5 % Final   VITD 09/19/2023 62.36  30.00 - 100.00 ng/mL Final  Scanned Document on 02/08/2023  Component Date Value Ref Range Status   [facility] Colonoscopy 11/14/2021 See Report (in chart)  See Report (in chart), Patient Reported Final  Lab on 02/06/2023  Component Date Value Ref Range Status   WBC 02/06/2023 4.6  4.0 - 10.5 K/uL Final   RBC 02/06/2023 5.36  4.22 - 5.81 Mil/uL Final   Platelets 02/06/2023 264.0  150.0 - 400.0 K/uL Final   Hemoglobin 02/06/2023 16.1  13.0 - 17.0 g/dL Final   HCT 28/41/3244 47.4  39.0 - 52.0 % Final   MCV 02/06/2023 88.4  78.0 - 100.0 fl Final   MCHC 02/06/2023 34.0  30.0 - 36.0 g/dL Final   RDW 07/26/7251 13.3  11.5 - 15.5 % Final   Sodium 02/06/2023 136  135 - 145 mEq/L Final   Potassium 02/06/2023 3.8  3.5 - 5.1 mEq/L Final   Chloride 02/06/2023 102  96 - 112 mEq/L Final   CO2 02/06/2023 26  19 - 32 mEq/L Final   Glucose, Bld 02/06/2023 113 (H)  70 - 99 mg/dL Final   BUN 66/44/0347 14  6 - 23 mg/dL Final   Creatinine, Ser 02/06/2023 1.06  0.40 - 1.50 mg/dL Final   Total Bilirubin 02/06/2023 0.6  0.2 - 1.2 mg/dL Final   Alkaline Phosphatase 02/06/2023 52  39 - 117 U/L Final   AST 02/06/2023 17  0 - 37 U/L Final   ALT 02/06/2023 27  0 - 53 U/L Final   Total Protein 02/06/2023 7.4  6.0 - 8.3 g/dL Final   Albumin 42/59/5638 4.5  3.5 - 5.2 g/dL Final  GFR 02/06/2023 83.51  >60.00 mL/min Final   Calcium 02/06/2023 9.7  8.4 - 10.5 mg/dL Final   TSH 98/05/9146 2.28  0.35 - 5.50 uIU/mL Final   Cholesterol 02/06/2023 240 (H)  0 - 200 mg/dL Final   Triglycerides 82/95/6213 183.0 (H)  0.0 - 149.0 mg/dL Final   HDL 08/65/7846 31.10 (L)  >96.29 mg/dL Final   VLDL 52/84/1324 36.6  0.0 - 40.0 mg/dL Final   LDL Cholesterol 02/06/2023 173 (H)  0 - 99 mg/dL Final    Total CHOL/HDL Ratio 02/06/2023 8   Final   NonHDL 02/06/2023 209.28   Final  Office Visit on 02/05/2023  Component Date Value Ref Range Status   Color, Urine 02/05/2023 YELLOW  Yellow;Lt. Yellow;Straw;Dark Yellow;Amber;Green;Red;Brown Final   APPearance 02/05/2023 CLEAR  Clear;Turbid;Slightly Cloudy;Cloudy Final   Specific Gravity, Urine 02/05/2023 <=1.005 (A)  1.000 - 1.030 Final   pH 02/05/2023 7.0  5.0 - 8.0 Final   Total Protein, Urine 02/05/2023 NEGATIVE  Negative Final   Urine Glucose 02/05/2023 NEGATIVE  Negative Final   Ketones, ur 02/05/2023 NEGATIVE  Negative Final   Bilirubin Urine 02/05/2023 NEGATIVE  Negative Final   Hgb urine dipstick 02/05/2023 NEGATIVE  Negative Final   Urobilinogen, UA 02/05/2023 0.2  0.0 - 1.0 Final   Leukocytes,Ua 02/05/2023 NEGATIVE  Negative Final   Nitrite 02/05/2023 NEGATIVE  Negative Final   WBC, UA 02/05/2023 0-2/hpf  0-2/hpf Final   RBC / HPF 02/05/2023 none seen  0-2/hpf Final  Admission on 04/29/2022, Discharged on 04/29/2022  Component Date Value Ref Range Status   Sodium 04/29/2022 137  135 - 145 mmol/L Final   Potassium 04/29/2022 4.1  3.5 - 5.1 mmol/L Final   Chloride 04/29/2022 100  98 - 111 mmol/L Final   CO2 04/29/2022 29  22 - 32 mmol/L Final   Glucose, Bld 04/29/2022 86  70 - 99 mg/dL Final   BUN 40/04/2724 18  6 - 20 mg/dL Final   Creatinine, Ser 04/29/2022 1.38 (H)  0.61 - 1.24 mg/dL Final   Calcium 36/64/4034 10.0  8.9 - 10.3 mg/dL Final   GFR, Estimated 04/29/2022 >60  >60 mL/min Final   Anion gap 04/29/2022 8  5 - 15 Final   WBC 04/29/2022 6.5  4.0 - 10.5 K/uL Final   RBC 04/29/2022 5.48  4.22 - 5.81 MIL/uL Final   Hemoglobin 04/29/2022 17.2 (H)  13.0 - 17.0 g/dL Final   HCT 74/25/9563 48.3  39.0 - 52.0 % Final   MCV 04/29/2022 88.1  80.0 - 100.0 fL Final   MCH 04/29/2022 31.4  26.0 - 34.0 pg Final   MCHC 04/29/2022 35.6  30.0 - 36.0 g/dL Final   RDW 87/56/4332 12.8  11.5 - 15.5 % Final   Platelets 04/29/2022 292  150 -  400 K/uL Final   nRBC 04/29/2022 0.0  0.0 - 0.2 % Final   Neutrophils Relative % 04/29/2022 40  % Final   Neutro Abs 04/29/2022 2.6  1.7 - 7.7 K/uL Final   Lymphocytes Relative 04/29/2022 46  % Final   Lymphs Abs 04/29/2022 3.0  0.7 - 4.0 K/uL Final   Monocytes Relative 04/29/2022 8  % Final   Monocytes Absolute 04/29/2022 0.5  0.1 - 1.0 K/uL Final   Eosinophils Relative 04/29/2022 5  % Final   Eosinophils Absolute 04/29/2022 0.3  0.0 - 0.5 K/uL Final   Basophils Relative 04/29/2022 1  % Final   Basophils Absolute 04/29/2022 0.1  0.0 - 0.1 K/uL Final  Immature Granulocytes 04/29/2022 0  % Final   Abs Immature Granulocytes 04/29/2022 0.02  0.00 - 0.07 K/uL Final   Color, Urine 04/29/2022 YELLOW  YELLOW Final   APPearance 04/29/2022 CLEAR  CLEAR Final   Specific Gravity, Urine 04/29/2022 1.023  1.005 - 1.030 Final   pH 04/29/2022 6.5  5.0 - 8.0 Final   Glucose, UA 04/29/2022 NEGATIVE  NEGATIVE mg/dL Final   Hgb urine dipstick 04/29/2022 NEGATIVE  NEGATIVE Final   Bilirubin Urine 04/29/2022 NEGATIVE  NEGATIVE Final   Ketones, ur 04/29/2022 15 (A)  NEGATIVE mg/dL Final   Protein, ur 16/04/9603 NEGATIVE  NEGATIVE mg/dL Final   Nitrite 54/03/8118 NEGATIVE  NEGATIVE Final   Leukocytes,Ua 04/29/2022 SMALL (A)  NEGATIVE Final   RBC / HPF 04/29/2022 0-5  0 - 5 RBC/hpf Final   WBC, UA 04/29/2022 6-10  0 - 5 WBC/hpf Final   Bacteria, UA 04/29/2022 RARE (A)  NONE SEEN Final   Mucus 04/29/2022 PRESENT   Final  Admission on 02/13/2022, Discharged on 02/13/2022  Component Date Value Ref Range Status   Group A Strep by PCR 02/13/2022 NOT DETECTED  NOT DETECTED Final   SARS Coronavirus 2 by RT PCR 02/13/2022 NEGATIVE  NEGATIVE Final   Influenza A by PCR 02/13/2022 NEGATIVE  NEGATIVE Final   Influenza B by PCR 02/13/2022 NEGATIVE  NEGATIVE Final  No image results found. CT CARDIAC SCORING (SELF PAY ONLY) Addendum Date: 10/28/2023 ADDENDUM REPORT: 10/28/2023 23:43 EXAM: OVER-READ INTERPRETATION  CT  CHEST The following report is an over-read performed by radiologist Dr. Chadwick Colonel of [facility] on 10/28/2023. This over-read does not include interpretation of cardiac or coronary anatomy or pathology. The coronary calcium score interpretation by the cardiologist is attached. COMPARISON:  None. FINDINGS: Vascular: No aortic atherosclerosis. The included aorta is normal in caliber. Mediastinum/nodes: No adenopathy or mass. Unremarkable esophagus. Lungs: No focal airspace disease. No pulmonary nodule. No pleural fluid. The included airways are patent. Upper abdomen: No acute or unexpected findings. Musculoskeletal: There are no acute or suspicious osseous abnormalities. IMPRESSION: No acute or unexpected extracardiac findings. Electronically Signed   By: Chadwick Colonel M.D.   On: 10/28/2023 23:43   Result Date: 10/28/2023 CLINICAL DATA:  Cardiovascular Disease Risk stratification EXAM: Coronary Calcium Score TECHNIQUE: A gated, non-contrast computed tomography scan of the heart was performed using 3mm slice thickness. Axial images were analyzed on a dedicated workstation. Calcium scoring of the coronary arteries was performed using the Agatston method. FINDINGS: Coronary Calcium Score: Left main: 0 Left anterior descending artery: 0 Left circumflex artery: 0.61 Right coronary artery: 0 Total: 0.61 Percentile: 76 Pericardium: Normal. Ascending Aorta: Normal caliber. Non-cardiac: See separate report from [facility]. IMPRESSION: Coronary calcium score of 0.61. This was 28 percentile for age-, race-, and sex-matched controls. RECOMMENDATIONS: Coronary artery calcium (CAC) score is a strong predictor of incident coronary heart disease (CHD) and provides predictive information beyond traditional risk factors. CAC scoring is reasonable to use in the decision to withhold, postpone, or initiate statin therapy in intermediate-risk or selected borderline-risk asymptomatic adults (age 32-75 years  and LDL-C >=70 to <190 mg/dL) who do not have diabetes or established atherosclerotic cardiovascular disease (ASCVD).* In intermediate-risk (10-year ASCVD risk >=7.5% to <20%) adults or selected borderline-risk (10-year ASCVD risk >=5% to <7.5%) adults in whom a CAC score is measured for the purpose of making a treatment decision the following recommendations have been made: If CAC=0, it is reasonable to withhold statin therapy and reassess in 5  to 10 years, as long as higher risk conditions are absent (diabetes mellitus, family history of premature CHD in first degree relatives (males <55 years; females <65 years), cigarette smoking, or LDL >=190 mg/dL). If CAC is 1 to 99, it is reasonable to initiate statin therapy for patients >=39 years of age. If CAC is >=100 or >=75th percentile, it is reasonable to initiate statin therapy at any age. Cardiology referral should be considered for patients with CAC scores >=400 or >=75th percentile. *2018 AHA/ACC/AACVPR/AAPA/ABC/ACPM/ADA/AGS/APhA/ASPC/NLA/PCNA Guideline on the Management of Blood Cholesterol: A Report of the American College of Cardiology/American Heart Association Task Force on Clinical Practice Guidelines. J Am Coll Cardiol. 2019;73(24):3168-3209. Alexandria Angel, MD Electronically Signed: By: Alexandria Angel M.D. On: 10/23/2023 08:57         Assessment & Plan Coronary artery disease due to calcified coronary lesion He has a small amount of coronary artery calcification in the left circumflex artery, with a CT coronary calcium score of 0.61, placing him in the 76th percentile for his age. This indicates early coronary artery disease, though subclinical and unlikely to cause a heart attack soon. Treating this early sign is recommended to prevent progression and future cardiovascular events. Start high-intensity statin therapy, such as rosuvastatin, to lower LDL cholesterol and potentially dissolve the calcification. Discuss daily aspirin therapy to  prevent heart attacks, considering the low benefit against the low risk. Consider PCSK9 inhibitors if LDL levels do not decrease sufficiently with statins alone. Provide information on the risks and benefits of statins and PCSK9 inhibitors, noting the low risk of muscle cramps and liver issues with statins. Encourage dietary and lifestyle modifications to support cholesterol management. Mixed hyperlipidemia He has significantly elevated LDL cholesterol at 197 mg/dL and low HDL cholesterol at 38 mg/dL, indicating a genetic predisposition to dyslipidemia. Aggressive cholesterol management is crucial to prevent the progression of coronary artery disease and reduce the risk of cardiovascular events. Initiate high-intensity statin therapy to lower LDL cholesterol. Consider adding PCSK9 inhibitors if LDL cholesterol does not reach target levels. Advise on dietary and lifestyle changes to improve cholesterol levels. Prediabetes He has mild prediabetes, an additional risk factor for cardiovascular disease, noted in the context of overall cardiovascular risk management. Monitor blood glucose levels and provide guidance on lifestyle modifications to prevent progression to diabetes. Arthritis Refilled ibuprofen  per patient request          Orders Placed in Encounter:   Lab Orders         Lipid panel     Meds ordered this encounter  Medications   ibuprofen  (ADVIL ) 800 MG tablet    Sig: Take 1 tablet (800 mg total) by mouth every 8 (eight) hours as needed. Take with food to prevent GI upset    Dispense:  30 tablet    Refill:  0   rosuvastatin (CRESTOR) 40 MG tablet    Sig: Take 1 tablet (40 mg total) by mouth daily.    Dispense:  31 tablet    Refill:  3     Orders Placed This Encounter  Procedures   Lipid panel    Standing Status:   Future    Expiration Date:   01/09/2025     This document was synthesized by artificial intelligence ([employer]bridge) using HIPAA-compliant recording of the clinical  interaction;   We discussed the use of AI scribe software for clinical note transcription with the patient, who gave verbal consent to proceed. additional Info: This encounter employed state-of-the-art, real-time, collaborative documentation. The patient  actively reviewed and assisted in updating their electronic medical record on a shared screen, ensuring transparency and facilitating joint problem-solving for the problem list, overview, and plan. This approach promotes accurate, informed care. The treatment plan was discussed and reviewed in detail, including medication safety, potential side effects, and all patient questions. We confirmed understanding and comfort with the plan. Follow-up instructions were established, including contacting the office for any concerns, returning if symptoms worsen, persist, or new symptoms develop, and precautions for potential emergency department visits.

## 2024-01-10 NOTE — Patient Instructions (Addendum)
 VISIT SUMMARY:  Today, we discussed your cholesterol management and overall cardiovascular health. Your LDL cholesterol is high, and your HDL cholesterol is low, which are risk factors for heart disease. Additionally, your CT coronary calcium score indicates early coronary artery disease. We also reviewed your mild prediabetes and its implications for your heart health.  YOUR PLAN:  -EARLY CORONARY ARTERY DISEASE: Early coronary artery disease means there is a small amount of calcification in your coronary arteries, which can lead to heart disease if not managed. We recommend starting high-intensity statin therapy with rosuvastatin to lower your LDL cholesterol and potentially reduce the calcification. We also discussed the possibility of daily aspirin therapy to prevent heart attacks and the option of PCSK9 inhibitors if your LDL levels do not decrease sufficiently with statins alone. It's important to make dietary and lifestyle changes to support your cholesterol management.  -HYPERLIPIDEMIA: Hyperlipidemia means you have high levels of LDL cholesterol and low levels of HDL cholesterol, which can increase your risk of heart disease. We recommend starting high-intensity statin therapy to lower your LDL cholesterol. If your LDL cholesterol does not reach target levels, we may consider adding PCSK9 inhibitors. Additionally, making dietary and lifestyle changes can help improve your cholesterol levels.  -PREDIABETES: Prediabetes means your blood sugar levels are higher than normal but not high enough to be classified as diabetes. This condition increases your risk of developing diabetes and heart disease. We will monitor your blood glucose levels and provide guidance on lifestyle modifications to prevent progression to diabetes.  INSTRUCTIONS:  Please start taking rosuvastatin as prescribed. We will monitor your cholesterol and blood glucose levels regularly. Consider daily aspirin therapy and discuss it  with your pharmacist. Make dietary and lifestyle changes to support your cholesterol and blood sugar management. Follow up with us  in 3 months to review your progress and adjust your treatment plan if necessary.  [facility] Lab Results Component Value Date HDL 38.20 (L) 09/19/2023 HDL 31.10 (L) 02/06/2023 CHOLHDL 7 09/19/2023 CHOLHDL 8 02/06/2023 Lab Results Component Value Date LDLCALC 197 (H) 09/19/2023 LDLCALC 173 (H) 02/06/2023 Lab Results Component Value Date TRIG 163.0 (H) 09/19/2023 TRIG 183.0 (H) 02/06/2023 Lab Results Component Value Date CHOL 268 (H) 09/19/2023 CHOL 240 (H) 02/06/2023 The 10-year ASCVD risk score (Arnett DK, et al., 2019) is: 4.4% Values used to calculate the score: Age: 48 years Clincally relevant sex: Male Is Non-Hispanic African American: Yes Diabetic: No Tobacco smoker: No Systolic Blood Pressure: 110 mmHg Is BP treated: No HDL Cholesterol: 38.2 mg/dL Total Cholesterol: 629 mg/dL Lab Results Component Value Date ALT 25 09/19/2023 AST 21 09/19/2023 ALKPHOS 43 09/19/2023 TSH 2.230 09/19/2023 HGBA1C 5.7 09/19/2023 Body mass index is 26.28 kg/m. Coronary Calcium Score TECHNIQUE: A gated, non-contrast computed tomography scan of the heart was performed using 3mm slice thickness. Axial images were analyzed on a dedicated workstation. Calcium scoring of the coronary arteries was performed using the Agatston method. FINDINGS: Coronary Calcium Score: Left main: 0 Left anterior descending artery: 0 Left circumflex artery: 0.61 Right coronary artery: 0 Total: 0.61 Percentile: 76 Pericardium: Normal. Ascending Aorta: Normal caliber. Non-cardiac: See separate report from [facility]. IMPRESSION: Coronary calcium score of 0.61. This was 79 percentile for age-, race-, and sex-matched controls. RECOMMENDATIONS: Coronary artery calcium (CAC) score is a strong predictor of incident coronary heart disease (CHD) and provides predictive information beyond traditional risk factors. CAC  scoring is reasonable to use in the decision to withhold, postpone, or initiate statin therapy in intermediate-risk or selected borderline-risk  asymptomatic adults (age 32-75 years and LDL-C >=70 to <190 mg/dL) who do not have diabetes or established atherosclerotic cardiovascular disease (ASCVD).* In intermediate-risk (10-year ASCVD risk >=7.5% to <20%) adults or selected borderline-risk (10-year ASCVD risk >=5% to <7.5%) adults in whom a CAC score is measured for the purpose of making a treatment decision the following recommendations have been made: patient wants more information about risks and benefits of statins 48 yo. as pcp I'm recommending goal ldl 55 or less with intensive statin and possibly pscsk9 treatment. he would like information about risks and benefits of these interventions please be comprehensive  As [facility], I have processed the provided patient data. Here is a comprehensive summary of the risks and benefits of intensive statin therapy and PCSK9 inhibitors, structured for your discussion with the patient. Patient Profile & Clinical Rationale Summary Patient: 48 year old Non-Hispanic African American male. Primary Diagnosis: Severe Hypercholesterolemia. Key Findings: LDL-C: 197 mg/dL. This level is severely elevated and is a primary indication for intensive therapy, as it strongly suggests a genetic predisposition such as Familial Hypercholesterolemia (FH). This finding alone places him in a high-risk category, often warranting treatment regardless of the calculated 10-year risk score. 10-Year ASCVD Risk Score: 4.4% (Borderline Risk). Risk-Enhancing Factors: The calculated score significantly underestimates this patient's true risk due to the presence of multiple potent risk-enhancing factors: Coronary Artery Calcium (CAC) Score: While the absolute score of 0.61 is low, the 76th percentile ranking is critical. It indicates he has more coronary plaque than 76% of men his age and race,  signifying a higher-than-average atherosclerotic burden. Prediabetes: HbA1c of 5.7%. Low HDL-C: 38.2 mg/dL. Elevated Triglycerides: 163 mg/dL. PCP Recommendation Rationale: Your recommendation for intensive therapy (goal LDL < 55 mg/dL) is clinically appropriate. This aggressive target is typically reserved for patients with established ASCVD (secondary prevention) or those with very high-risk primary prevention profiles, which this patient demonstrates due to the combination of severe hypercholesterolemia and multiple risk enhancers. Safety Labs: Baseline liver function tests (ALT, AST, ALKPHOS) and TSH are within normal limits, providing a safe baseline for initiating therapy.  Discussion Points for Patient Counseling Here is a structured breakdown of risks and benefits to facilitate your conversation. Part 1: High-Intensity Statin Therapy (e.g., Atorvastatin 40-80mg , Rosuvastatin 20-40mg ) The first and most important step is a powerful medication called a statin. Our goal is to use a high-dose version to dramatically lower your risk of a future heart attack or stroke. Benefits of Statins: Powerful Cholesterol Reduction: This is the primary benefit. High-intensity statins are proven to lower LDL (bad) cholesterol by 50% or more. For you, this could mean bringing your LDL from 197 down to under 100 with this medication alone. Plaque Stabilization: Statins do more than just lower numbers. They work inside the artery walls to make existing plaque more stable and less likely to rupture, which is what causes a heart attack. Reduces Inflammation: They have an anti-inflammatory effect on the blood vessels, which is a key part of slowing down the process of atherosclerosis (hardening of the arteries). Proven to Save Lives: Decades of research show that for high-risk individuals, statins significantly reduce the risk of heart attack, stroke, and death from cardiovascular disease. Risks & Side Effects of  Statins: Common and Generally Mild: Muscle Aches (Myalgia): This is the most common concern. It can feel like soreness, tiredness, or weakness in the muscles. Management: If this occurs, we can try a different statin, lower the dose, or check for other causes. It is usually manageable and  does not represent dangerous muscle damage. Less Common: Liver Enzyme Elevation: We checked your liver function, and it's perfectly normal. We will monitor it, but it's rare for statins to cause serious liver issues. The numbers often return to normal even if you continue the medication. Increased Blood Sugar: Statins can slightly increase the risk of developing type 2 diabetes, especially in people who are already at risk (like yourself, with an HbA1c of 5.7%). However, the benefit of preventing a heart attack or stroke far outweighs this small risk. We will monitor your blood sugar as part of your routine care. Rare but Serious (Important to know the signs): Severe Muscle Damage (Rhabdomyolysis): This is extremely rare (<1 in 10,000). Signs include severe muscle pain, weakness, and dark (tea-colored) urine. If you ever experience this, you should stop the medication and contact me immediately.  Part 2: PCSK9 Inhibitors (e.g., Repatha, Praluent) If the statin alone doesn't get us  to our very aggressive goal of an LDL less than 55, or if you can't tolerate the high dose, our next step would be to add a second, very powerful medication called a PCSK9 inhibitor. Benefits of PCSK9 Inhibitors: Exceptional LDL Reduction: These are injectable medications that work through a different pathway than statins. When added to a statin, they can lower LDL by an additional 50-60%. This is how we can achieve a very low target like <55 mg/dL. Excellent Safety Profile: They are highly targeted and generally have fewer systemic side effects (like muscle aches) than statins. Proven Risk Reduction: Large clinical trials have shown  that adding a PCSK9 inhibitor on top of a statin provides further, significant reduction in the risk of heart attack and stroke. Risks & Side Effects of PCSK9 Inhibitors: Administration: They are self-administered injections, typically once every 2 or 4 weeks. Common: Injection Site Reactions: The most common side effect is mild pain, redness, or swelling at the injection site, which usually resolves quickly. Less Common: Cold or Flu-like Symptoms: Some people report symptoms of a common cold or the flu. Rare: Allergic Reactions: As with any medication, there is a very small risk of an allergic reaction.  Summary & Proposed Plan Acknowledge the Situation: Your lab results show a very high cholesterol level that, combined with other findings, puts you at a significantly increased risk for heart disease over time, more than the standard calculator would suggest. The good news is that we have extremely effective tools to lower this risk. Initial Step: We will start with a high-intensity statin. This is the cornerstone of treatment and will likely cut your bad cholesterol by at least half. Follow-Up: We will re-check your cholesterol and safety labs in 6-8 weeks to see how well the medication is working and ensure you are tolerating it. Contingency Plan: If your LDL cholesterol is not yet at our goal of <55 mg/dL, we will then discuss adding the PCSK9 inhibitor injection to get you the rest of the way there. Lifestyle is Key: These medications work best when combined with a heart-healthy diet, regular physical activity, and maintaining a healthy weight. They are a powerful supplement to, not a replacement for, a healthy lifestyle. This comprehensive approach should provide the patient with a clear understanding of the rationale, benefits, and potential risks, empowering them to participate in shared decision-making.  he is concerned about how long the treatments will last (forever) and  whether he can resolve situation with diet alone. provide response that is empathetic and understands his tribulation  Of course. It  is completely understandable for the patient to feel this way. A diagnosis that requires lifelong management can feel overwhelming and disempowering. Here is a response that is both empathetic and medically direct, designed to be used in your conversation with him.  Empathetic Response for Patient Counseling Thank you for being so open about your concerns. That is the most important and most common question people ask when they're in your situation, and I want you to know that your feelings are completely valid. Hearing that you might need to be on a medication for the rest of your life can feel incredibly daunting, almost like a sentence has been passed down. And it's absolutely natural to want to find a way to fix this yourself, with something you can control, like diet. Let's talk about both of those things, because you're right to ask. First, let's talk about diet. Your commitment to wanting to use diet is fantastic, and I want to be very clear: a heart-healthy diet and regular exercise are the foundation of everything we are going to do. They are non-negotiable for your long-term health, and they will help lower your cholesterol and reduce your risk. You have significant power there. However, in your specific case, your LDL cholesterol is 197. A number that high is a strong signal that this isn't just about what you're eating. It points to a genetic component. The best way to think about it is like this: your liver has a 'thermostat' for making cholesterol. For many people, diet can help turn that thermostat down. But for some people, because of their genetics, that thermostat is simply set too high from the start. Your body is programmed to overproduce 'bad' cholesterol, regardless of how perfectly you eat. It is not a personal failure or a lack of willpower; it's a  matter of your body's internal wiring. A perfect diet might lower your number by 10-20%, but it simply can't overcome your body's natural tendency to produce this much. Now, let's address the 'forever' part. The honest answer is that yes, this is very likely a lifelong management plan. But I would ask you to try and reframe how we think about it. Instead of seeing it as a lifelong burden, I want you to see it as a lifelong tool for protection. You wouldn't get in a car without a seatbelt, especially if you knew you were going on a dangerous road. Right now, your body is on a path that significantly increases the risk of a future heart attack or stroke. This medication is your seatbelt. It's a proactive, protective shield you put in place every day to ensure you are here and healthy for yourself and your family for decades to come. We have identified a vulnerability in your system--that cholesterol thermostat being set too high. The medication is simply the tool we use to correct that vulnerability. So, here is our partnership: Your role is to take control of the powerful lifestyle factors--the diet and the exercise. That is your domain, and it is absolutely crucial. My role is to provide the medical tool--the statin--to address the genetic part that lifestyle alone cannot fix. Together, by combining your hard work with the protection of the medication, we can give you the best possible chance at a long, healthy life, free from heart disease. This isn't about taking control away from you; it's about giving you an additional, powerful tool to secure your future.

## 2024-01-21 ENCOUNTER — Ambulatory Visit: Admitting: Internal Medicine

## 2024-01-22 DIAGNOSIS — F1020 Alcohol dependence, uncomplicated: Secondary | ICD-10-CM | POA: Diagnosis not present

## 2024-01-23 ENCOUNTER — Other Ambulatory Visit: Payer: Self-pay | Admitting: Medical Genetics

## 2024-01-28 ENCOUNTER — Other Ambulatory Visit (HOSPITAL_COMMUNITY)

## 2024-01-29 DIAGNOSIS — E291 Testicular hypofunction: Secondary | ICD-10-CM | POA: Diagnosis not present

## 2024-01-29 DIAGNOSIS — Z79890 Hormone replacement therapy (postmenopausal): Secondary | ICD-10-CM | POA: Diagnosis not present

## 2024-01-29 DIAGNOSIS — E7849 Other hyperlipidemia: Secondary | ICD-10-CM | POA: Diagnosis not present

## 2024-01-31 DIAGNOSIS — E291 Testicular hypofunction: Secondary | ICD-10-CM | POA: Diagnosis not present

## 2024-02-19 DIAGNOSIS — F1020 Alcohol dependence, uncomplicated: Secondary | ICD-10-CM | POA: Diagnosis not present

## 2024-03-04 DIAGNOSIS — F1020 Alcohol dependence, uncomplicated: Secondary | ICD-10-CM | POA: Diagnosis not present

## 2024-04-01 DIAGNOSIS — F1020 Alcohol dependence, uncomplicated: Secondary | ICD-10-CM | POA: Diagnosis not present

## 2024-04-10 ENCOUNTER — Ambulatory Visit: Payer: Self-pay | Admitting: Surgery

## 2024-04-10 NOTE — H&P (Signed)
 Chief Complaint: New Consultation       History of Present Illness: Mario Cain is a 48 y.o. male who is seen today as an office consultation at the request of Dr. Jesus for evaluation of New Consultation .     This is a 48 year old male in excellent health who presents with a 2-year history of a slowly enlarging bulge in his left groin.  The patient is quite active and works out regularly.  He has noticed some worsening discomfort in his left groin.  When he is supine the discomfort resolves.  He denies any GI obstructive symptoms.  He does report a lot of urinary frequency and nocturia.  He feels that he never is able to completely empty his bladder.  He states that he did have a normal PSA performed recently.     Review of Systems: A complete review of systems was obtained from the patient.  I have reviewed this information and discussed as appropriate with the patient.  See HPI as well for other ROS.   Review of Systems  Constitutional: Negative.   HENT: Negative.    Eyes: Negative.   Respiratory: Negative.    Cardiovascular: Negative.   Gastrointestinal: Negative.   Genitourinary: Negative.   Musculoskeletal: Negative.   Skin: Negative.   Neurological: Negative.   Endo/Heme/Allergies: Negative.   Psychiatric/Behavioral: Negative.          Medical History: Past Medical History         Past Medical History:  Diagnosis Date   Allergy     High cholesterol          Problem List       Patient Active Problem List  Diagnosis   Abdominal swelling   Fatigue   Hyperlipidemia   Hypogonadism in male   Incomplete emptying of bladder   Internal hemorrhoids   Left inguinal hernia        Past Surgical History  History reviewed. No pertinent surgical history.      Allergies           Allergies  Allergen Reactions   Egg Derived Unknown   Other Other (See Comments)      Various pollen and trees.        Medications Ordered Prior to Encounter              Current Outpatient Medications on File Prior to Visit  Medication Sig Dispense Refill   CLOMID 50 mg tablet Take by mouth        No current facility-administered medications on file prior to visit.        Family History           Family History  Problem Relation Age of Onset   Cancer Mother     Hyperlipidemia (Elevated cholesterol) Mother          Tobacco Use History  Social History         Tobacco Use  Smoking Status Never  Smokeless Tobacco Never        Social History  Social History           Socioeconomic History   Marital status: Married  Tobacco Use   Smoking status: Never   Smokeless tobacco: Never  Substance and Sexual Activity   Alcohol  use: Never   Drug use: Never    Social Determinants of Health           Financial Resource Strain: Low Risk  (02/19/2023)    Received from      Overall Financial Resource Strain (CARDIA)     Difficulty of Paying Living Expenses: Not hard at all  Food Insecurity: No Food Insecurity (02/19/2023)    Received from [facility]    Hunger Vital Sign     Worried About Running Out of Food in the Last Year: Never true     Ran Out of Food in the Last Year: Never true  Transportation Needs: No Transportation Needs (02/19/2023)    Received from [facility] - Transportation     Lack of Transportation (Medical): No     Lack of Transportation (Non-Medical): No  Physical Activity: Insufficiently Active (02/19/2023)    Received from [facility]    Exercise Vital Sign     Days of Exercise per Week: 4 days     Minutes of Exercise per Session: 30 min  Stress: No Stress Concern Present (02/19/2023)    Received from [facility] - Occupational Stress Questionnaire     Feeling of Stress : Not at all  Social Connections: Moderately Integrated (02/19/2023)    Received from [facility]    Social Connection and Isolation Panel [NHANES]     Frequency of Communication with  Friends and Family: Three times a week     Frequency of Social Gatherings with Friends and Family: Once a week     Attends Religious Services: 1 to 4 times per year     Active Member of Clubs or Organizations: No     Marital Status: Married        Objective:      Body mass index is 25.47 kg/m.   Physical Exam    Constitutional:  WDWN in NAD, conversant, no obvious deformities; lying in bed comfortably Eyes:  Pupils equal, round; sclera anicteric; moist conjunctiva; no lid lag HENT:  Oral mucosa moist; good dentition  Neck:  No masses palpated, trachea midline; no thyromegaly Lungs:  CTA bilaterally; normal respiratory effort CV:  Regular rate and rhythm; no murmurs; extremities well-perfused with no edema Abd:  +bowel sounds, soft, non-tender, no palpable organomegaly; no palpable hernias GU: Bilateral descended testes, no testicular masses, small reducible left inguinal hernia.  No sign of right inguinal hernia. Musc: Normal gait; no apparent clubbing or cyanosis in extremities Lymphatic:  No palpable cervical or axillary lymphadenopathy Skin:  Warm, dry; no sign of jaundice Psychiatric - alert and oriented x 4; calm mood and affect     Assessment and Plan:  Diagnoses and all orders for this visit:   Non-recurrent unilateral inguinal hernia without obstruction or gangrene       Recommend left inguinal hernia repair with mesh.The surgical procedure has been discussed with the patient.  Potential risks, benefits, alternative treatments, and expected outcomes have been explained.  All of the patient's questions at this time have been answered.  The likelihood of reaching the patient's treatment goal is good.  The patient understands the proposed surgical procedure and wishes to proceed.   Donnice POUR. Belinda, MD, [facility] Surgery  General Surgery   04/10/2024 11:39 AM

## 2024-04-11 ENCOUNTER — Ambulatory Visit: Admitting: Internal Medicine

## 2024-04-29 DIAGNOSIS — Z79890 Hormone replacement therapy (postmenopausal): Secondary | ICD-10-CM | POA: Diagnosis not present

## 2024-04-29 DIAGNOSIS — E291 Testicular hypofunction: Secondary | ICD-10-CM | POA: Diagnosis not present

## 2024-05-01 DIAGNOSIS — E291 Testicular hypofunction: Secondary | ICD-10-CM | POA: Diagnosis not present

## 2024-05-01 DIAGNOSIS — R6882 Decreased libido: Secondary | ICD-10-CM | POA: Diagnosis not present

## 2024-05-01 DIAGNOSIS — M2550 Pain in unspecified joint: Secondary | ICD-10-CM | POA: Diagnosis not present

## 2024-05-13 ENCOUNTER — Other Ambulatory Visit: Payer: Self-pay | Admitting: Medical Genetics

## 2024-05-13 DIAGNOSIS — Z006 Encounter for examination for normal comparison and control in clinical research program: Secondary | ICD-10-CM

## 2024-06-05 DIAGNOSIS — K4090 Unilateral inguinal hernia, without obstruction or gangrene, not specified as recurrent: Secondary | ICD-10-CM | POA: Diagnosis not present

## 2024-06-05 DIAGNOSIS — D176 Benign lipomatous neoplasm of spermatic cord: Secondary | ICD-10-CM | POA: Diagnosis not present

## 2024-06-05 DIAGNOSIS — G8918 Other acute postprocedural pain: Secondary | ICD-10-CM | POA: Diagnosis not present

## 2024-06-10 LAB — GENECONNECT MOLECULAR SCREEN: Genetic Analysis Overall Interpretation: NEGATIVE

## 2024-09-19 ENCOUNTER — Encounter: Payer: BC Managed Care – PPO | Admitting: Internal Medicine
